# Patient Record
Sex: Female | Born: 1980 | Race: White | Hispanic: No | Marital: Married | Smoking: Never smoker
Health system: Southern US, Community
[De-identification: ages and names within clinical notes are randomized; demographics above are authoritative.]

## PROBLEM LIST (undated history)

## (undated) ENCOUNTER — Inpatient Hospital Stay (HOSPITAL_COMMUNITY): Payer: Self-pay

## (undated) DIAGNOSIS — Z8742 Personal history of other diseases of the female genital tract: Secondary | ICD-10-CM

## (undated) DIAGNOSIS — D649 Anemia, unspecified: Secondary | ICD-10-CM

## (undated) DIAGNOSIS — Z973 Presence of spectacles and contact lenses: Secondary | ICD-10-CM

## (undated) DIAGNOSIS — N920 Excessive and frequent menstruation with regular cycle: Secondary | ICD-10-CM

## (undated) DIAGNOSIS — R112 Nausea with vomiting, unspecified: Secondary | ICD-10-CM

## (undated) DIAGNOSIS — Z9889 Other specified postprocedural states: Secondary | ICD-10-CM

## (undated) HISTORY — PX: CRYOTHERAPY: SHX1416

---
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
## 2000-01-13 ENCOUNTER — Encounter (INDEPENDENT_AMBULATORY_CARE_PROVIDER_SITE_OTHER): Payer: Self-pay

## 2000-01-13 ENCOUNTER — Other Ambulatory Visit: Admission: RE | Admit: 2000-01-13 | Discharge: 2000-01-13 | Payer: Self-pay | Admitting: Obstetrics and Gynecology

## 2002-07-19 ENCOUNTER — Emergency Department (HOSPITAL_COMMUNITY): Admission: EM | Admit: 2002-07-19 | Discharge: 2002-07-19 | Payer: Self-pay | Admitting: Emergency Medicine

## 2003-07-20 ENCOUNTER — Inpatient Hospital Stay (HOSPITAL_COMMUNITY): Admission: AD | Admit: 2003-07-20 | Discharge: 2003-07-20 | Payer: Self-pay | Admitting: *Deleted

## 2003-07-22 ENCOUNTER — Inpatient Hospital Stay (HOSPITAL_COMMUNITY): Admission: AD | Admit: 2003-07-22 | Discharge: 2003-07-26 | Payer: Self-pay | Admitting: Obstetrics and Gynecology

## 2003-07-24 ENCOUNTER — Encounter (INDEPENDENT_AMBULATORY_CARE_PROVIDER_SITE_OTHER): Payer: Self-pay | Admitting: Specialist

## 2008-04-16 ENCOUNTER — Emergency Department (HOSPITAL_COMMUNITY): Admission: EM | Admit: 2008-04-16 | Discharge: 2008-04-16 | Payer: Self-pay | Admitting: Emergency Medicine

## 2008-10-01 ENCOUNTER — Ambulatory Visit (HOSPITAL_COMMUNITY): Admission: RE | Admit: 2008-10-01 | Discharge: 2008-10-01 | Payer: Self-pay | Admitting: Obstetrics & Gynecology

## 2009-01-01 ENCOUNTER — Inpatient Hospital Stay (HOSPITAL_COMMUNITY): Admission: AD | Admit: 2009-01-01 | Discharge: 2009-01-04 | Payer: Self-pay | Admitting: Obstetrics and Gynecology

## 2009-01-08 ENCOUNTER — Ambulatory Visit (HOSPITAL_COMMUNITY): Admission: AD | Admit: 2009-01-08 | Discharge: 2009-01-08 | Payer: Self-pay | Admitting: Obstetrics and Gynecology

## 2009-01-09 ENCOUNTER — Ambulatory Visit (HOSPITAL_COMMUNITY): Admission: AD | Admit: 2009-01-09 | Discharge: 2009-01-09 | Payer: Self-pay | Admitting: Obstetrics and Gynecology

## 2009-01-10 ENCOUNTER — Ambulatory Visit (HOSPITAL_COMMUNITY): Admission: AD | Admit: 2009-01-10 | Discharge: 2009-01-10 | Payer: Self-pay | Admitting: Obstetrics and Gynecology

## 2009-01-11 ENCOUNTER — Ambulatory Visit (HOSPITAL_COMMUNITY): Admission: AD | Admit: 2009-01-11 | Discharge: 2009-01-11 | Payer: Self-pay | Admitting: Obstetrics and Gynecology

## 2009-01-12 ENCOUNTER — Ambulatory Visit (HOSPITAL_COMMUNITY): Admission: AD | Admit: 2009-01-12 | Discharge: 2009-01-12 | Payer: Self-pay | Admitting: Obstetrics and Gynecology

## 2009-01-14 ENCOUNTER — Ambulatory Visit (HOSPITAL_COMMUNITY): Admission: AD | Admit: 2009-01-14 | Discharge: 2009-01-14 | Payer: Self-pay | Admitting: Obstetrics and Gynecology

## 2009-01-15 ENCOUNTER — Ambulatory Visit (HOSPITAL_COMMUNITY): Admission: AD | Admit: 2009-01-15 | Discharge: 2009-01-15 | Payer: Self-pay | Admitting: Obstetrics and Gynecology

## 2009-01-18 ENCOUNTER — Ambulatory Visit (HOSPITAL_COMMUNITY): Admission: AD | Admit: 2009-01-18 | Discharge: 2009-01-18 | Payer: Self-pay | Admitting: Obstetrics & Gynecology

## 2009-01-19 ENCOUNTER — Ambulatory Visit (HOSPITAL_COMMUNITY): Admission: AD | Admit: 2009-01-19 | Discharge: 2009-01-19 | Payer: Self-pay | Admitting: Obstetrics and Gynecology

## 2009-03-16 ENCOUNTER — Ambulatory Visit (HOSPITAL_COMMUNITY): Admission: RE | Admit: 2009-03-16 | Discharge: 2009-03-16 | Payer: Self-pay | Admitting: General Surgery

## 2009-03-16 ENCOUNTER — Encounter (INDEPENDENT_AMBULATORY_CARE_PROVIDER_SITE_OTHER): Payer: Self-pay | Admitting: General Surgery

## 2009-03-16 HISTORY — PX: LAPAROSCOPIC CHOLECYSTECTOMY: SUR755

## 2010-12-27 LAB — COMPREHENSIVE METABOLIC PANEL
ALT: 40 U/L — ABNORMAL HIGH (ref 0–35)
AST: 38 U/L — ABNORMAL HIGH (ref 0–37)
Alkaline Phosphatase: 98 U/L (ref 39–117)
CO2: 27 mEq/L (ref 19–32)
Calcium: 9.7 mg/dL (ref 8.4–10.5)
GFR calc Af Amer: >60 mL/min (ref >60)
GFR calc non Af Amer: >60 mL/min (ref >60)
Glucose, Bld: 93 mg/dL (ref 70–99)
Potassium: 4.1 mEq/L (ref 3.5–5.1)
Sodium: 140 mEq/L (ref 135–145)
Total Protein: 7.2 g/dL (ref 6.0–8.3)

## 2010-12-27 LAB — DIFFERENTIAL
Basophils Relative: 1 % (ref 0–1)
Eosinophils Absolute: 1.5 10*3/uL — ABNORMAL HIGH (ref 0.0–0.7)
Eosinophils Relative: 15 % — ABNORMAL HIGH (ref 0–5)
Lymphs Abs: 2.5 10*3/uL (ref 0.7–4.0)
Monocytes Relative: 5 % (ref 3–12)

## 2010-12-27 LAB — CBC
Hemoglobin: 12.8 g/dL (ref 12.0–15.0)
MCHC: 32.5 g/dL (ref 30.0–36.0)
RBC: 5.02 MIL/uL (ref 3.87–5.11)

## 2010-12-29 LAB — CBC
MCHC: 34.5 g/dL (ref 30.0–36.0)
MCV: 79.6 fL (ref 78.0–100.0)
Platelets: 230 10*3/uL (ref 150–400)
Platelets: 305 10*3/uL (ref 150–400)
RBC: 4.51 MIL/uL (ref 3.87–5.11)
RDW: 14.7 % (ref 11.5–15.5)
WBC: 14.3 10*3/uL — ABNORMAL HIGH (ref 4.0–10.5)

## 2010-12-29 LAB — RPR: RPR Ser Ql: NONREACTIVE

## 2011-02-01 NOTE — Op Note (Signed)
Deborah [name], Deborah [name]               ACCOUNT NO.:  [id number]   MEDICAL RECORD NO.:  [id number]          PATIENT TYPE:  INP   LOCATION:  9147                          FACILITY:  [facility]   PHYSICIAN:  Zelphia Cairo, MD    DATE OF BIRTH:  1981/07/06   DATE OF PROCEDURE:  DATE OF DISCHARGE:  01/04/2009                               OPERATIVE REPORT   PREOPERATIVE DIAGNOSES:  1. Intrauterine pregnancy at term.  2. Labor.  3. Prior cesarean section, desires repeat.   POSTOPERATIVE DIAGNOSIS:  1. Intrauterine pregnancy at term.  2. Labor.  3. Prior cesarean section, desires repeat.   PROCEDURE:  Repeat low-transverse cesarean delivery.   SURGEON:  Zelphia Cairo, MD   ANESTHESIA:  Spinal.   COMPLICATIONS:  None.   FINDINGS:  Viable infant female with Apgars of 7 and 8, normal pelvic  anatomy.   COMPLICATIONS:  None.   CONDITION:  Stable to recovery room.   PROCEDURE IN DETAIL:  The patient was taken to the operating room after  informed consent was obtained.  She was placed in the supine position  with a left tilt after spinal anesthesia was found to be adequate.  She  was prepped and draped in sterile fashion.  Foley catheter was inserted  sterilely.  Pfannenstiel skin incision was made with a scalpel and  carried down to the underlying fascia.  Fascia was then incised in the  midline and extended laterally using Mayo scissors.  Kocher clamps were  used to grasp the superior portion of the fascia, was tented upwards and  the underlying rectus muscles were dissected off using the Bovie.  Peritoneum was then identified and entered sharply.  Peritoneal incision  was then extended superiorly and inferiorly with good visualization of  the bladder.  Bladder blade was then inserted.  Vesicouterine peritoneum  was dissected off the lower uterine segment and the bladder blade was  replaced to protect the bladder flap.   Uterine incision was made with a scalpel and extended bluntly.   Fetal  vertex was brought to the uterine incision.  The mouth and nose were  suctioned.  The shoulders and body followed with fundal pressure.  The  infant was dried and the cord was clamped and cut and the infant was  taken to the awaiting pediatric staff.  The uterus was exteriorized from  the pelvis.  The placenta was manually removed and then the uterus was  cleared of all clots and debris using a dry lap sponge.  The uterine  incision was reapproximated using 0 chromic double layer fashion and a  running locked stitch.  Once hemostasis was assured, the uterus placed  back into the pelvis and the pelvis was irrigated with warm normal  saline.  Peritoneum was reapproximated with Monocryl.  The fascia was  closed with a looped 0 PDS and the skin was closed with staples.  Sponge, lap, needle, and instrument counts were correct x2.      Zelphia Cairo, MD  Electronically Signed     GA/MEDQ  D:  03/06/2009  T:  03/07/2009  Job:  [id number] 

## 2011-02-01 NOTE — Discharge Summary (Signed)
NAMEAUTUMNE, Deborah [name]               ACCOUNT NO.:  [id number]   MEDICAL RECORD NO.:  [id number]          PATIENT TYPE:  INP   LOCATION:  9147                          FACILITY:  [facility]   PHYSICIAN:  Duke Salvia. Marcelle Overlie, M.D.DATE OF BIRTH:  14-Apr-1981   DATE OF ADMISSION:  01/01/2009  DATE OF DISCHARGE:  01/04/2009                               DISCHARGE SUMMARY   ADMISSION DIAGNOSES:  1. Intrauterine pregnancy at term.  2. Previous cesarean section, desires repeat.  3. Spontaneous onset of labor.   DISCHARGE DIAGNOSES:  1. Status post low-transverse cesarean section.  2. Viable female infant.   PROCEDURE:  Repeat low-transverse cesarean section.   REASON FOR ADMISSION:  Please see written H and P.   HOSPITAL COURSE:  The patient is a 30 year old gravida 2, para 1 that  was admitted to [facility] at 26 weeks' estimated  gestational age with spontaneous onset of labor.  The patient had had a  previous cesarean section, desired repeat.  The patient was then  transferred to the operating room where spinal anesthesia was  administered without difficulty.  Low-transverse incision was made with  delivery of a viable female infant with Apgars of 7 at one minute and 8  at five minutes.  The patient tolerated the procedure well and taken to  the recovery room in stable condition.  On postoperative day #1, the  patient was without complaint.  Vital signs were stable.  She was  afebrile.  Fundus firm and nontender.  Abdominal dressings were noted to  have a small amount of drainage noted on the bandage.  Laboratory  findings showed hemoglobin of 10.3, platelet count of 230,000, WBC count  of 9.6, and blood type was noted to be A positive.  On postoperative day  #2, the patient was without complaint.  Vital signs were stable.  She  was afebrile.  Fundus firm and nontender.  Incision was clean, dry, and  intact.  On postoperative day #3, the patient was without complaint.  Vital  signs remained stable.  She was afebrile.  Fundus firm and  nontender.  Incision was clean, dry, and intact.  Staples removed and  the patient was later discharged home.   CONDITION ON DISCHARGE:  Stable.   DIET:  Regular as tolerated.   ACTIVITY:  No heavy lifting, no driving x2 weeks, and no vaginal entry.   FOLLOWUP:  The patient is to follow up in the office in 1-2 weeks for  incision check.  She is to call for temperature greater than 100  degrees, persistent nausea, vomiting, heavy vaginal bleeding and/or  redness, or drainage from the incisional site.   DISCHARGE MEDICATIONS:  1. Tylox #30 one p.o. every 4-6 h. p.r.n.  2. Motrin 600 mg every 6 hours.  3. Prenatal vitamins 1 p.o. daily.  4. Colace 1 p.o. daily p.r.n.      Julio Sicks, N.P.      Richard M. Marcelle Overlie, M.D.  Electronically Signed    CC/MEDQ  D:  03/10/2009  T:  03/11/2009  Job:  [id number]

## 2011-02-01 NOTE — Op Note (Signed)
Deborah [name], Deborah [name]               ACCOUNT NO.:  [id number]   MEDICAL RECORD NO.:  [id number]          PATIENT TYPE:  AMB   LOCATION:  SDS                          FACILITY:  [facility]   PHYSICIAN:  Cherylynn Ridges, M.D.    DATE OF BIRTH:  1981/01/22   DATE OF PROCEDURE:  03/16/2009  DATE OF DISCHARGE:  03/16/2009                               OPERATIVE REPORT   PREOPERATIVE DIAGNOSIS:  Symptomatic cholelithiasis.   POSTOPERATIVE DIAGNOSIS:  Large gallstones and chronic cholecystitis.   PROCEDURE:  Laparoscopic cholecystectomy with cholangiogram.   SURGEON:  Marta Lamas. Lindie Spruce, M.D.   ASSISTANT:  Anselm Pancoast. Zachery Dakins, M.D.   ANESTHESIA:  General endotracheal.   ESTIMATED BLOOD LOSS:  100 mL.   COMPLICATION:  Bleeding from right inferior epigastric artery.   CONDITION:  Stable.   INDICATIONS FOR OPERATION:  The patient is a 30 year old female with  abdominal pain for several months, was pregnant, initially diagnosed  with gallstones and now comes in for an elective laparoscopic  cholecystectomy.   OPERATION:  The patient was taken to the operating room, placed on table  in the supine position.  After an adequate general endotracheal  anesthetic was administered, she was prepped and draped in the usual  sterile manner exposing the midline.   A supraumbilical midline incision was made down to the fascia.  We  incised the fascia using #15 blade and grabbed the edges of the fascia  using a Kocher clamp.  We were just to the right of the midline in the  muscle but we were able to dissect down through the posterior sheath  into the peritoneal cavity.  We then passed a pursestring suture of 0  Vicryl around the fascial opening, then passed a Hasson cannula into the  peritoneal cavity with minimal difficulty.   Once this was done, we placed the patient in reverse Trendelenburg with  the left side tilted down.  A right upper quadrant 5-mm cannula site was  placed along with the second one  in the right upper quadrant and then a  subxiphoid 5-mm cannula site was placed.  We then started the operation.   There were dense omental adhesions to the body in the infundibulum of  gallbladder which was dissected away free by electrocautery and blunt  dissection.  The duodenum was also drawn up towards the infundibulum  which was free without injuring it.   We were able to dissect out the cystic duct and cystic artery at the  triangle of Calot and then placed a clip along the gallbladder site.  A  cholecystodochotomy made using laparoscopic scissors.  The cholangiogram  was performed using a Cook catheter which had been passed through the  anterior abdominal wall.  Once this was done, it showed good flow into  the duodenum.  No obstruction.  No filling defects and good proximal  filling.  Once this was done, we removed the clips securing the catheter  in place, distally clipped the cystic duct x2 and then transected.  The  cystic artery was clipped proximally and distally x2 and transected.  We  then dissected gallbladder out of the hepatic bed with minimal  difficulty; however, because of the large size of stones containing  within it, we had some difficulty bringing in out supraumbilical  fashion.  In order to do so, we actually had to open the fascia  proximally between the stay sutures and in doing so we likely nicked the  right inferior epigastric artery.  We did retrieve the gallbladder  intact along with the stones, but then had to place some intraperitoneal  stitches in order to control the bleeding around the fascial opening.  This was done with the suture retriever and passer which came out on  each side of fascial opening which controlled the bleeding.  An  additional 0 Vicryl suture was also placed in order to control the  bleeding along with stay suture which was in place.   At the end of this, there was no more bleeding.  We bled approximately  100 mL into the  peritoneal cavity and externally.  There was also some  spillage of bowel externally but not intraperitoneally.  Once we had  completely removed the gallbladder and controlled the bleeding and  closed the fascia at supraumbilical site.  We removed all cannulas.  We  did irrigate with about 1.5 to 2 liters of saline.  There was no  bleeding subsequently.  All gas and fluid was aspirated, and then  cannulas removed.   The supraumbilical cannula site was closed using running subcuticular  suture of 4-0 Monocryl.  All sites were injected with 0.25% Marcaine  with epinephrine.  Once the supraumbilical site was closed, Dermabond  was placed on that and Dermabond was used as primary closure of all  other sites.  Steri-Strips and Tegaderm were used to complete all  dressings.  All needle counts, sponge counts, and instrument counts were  correct.      Cherylynn Ridges, M.D.  Electronically Signed     JOW/MEDQ  D:  03/16/2009  T:  03/17/2009  Job:  [id number]   cc:   Marcelino Duster L. Vincente Poli, M.D.

## 2011-02-04 NOTE — Discharge Summary (Signed)
NAME:  Deborah [name], Deborah [name]                       ACCOUNT NO.:  [id number]   MEDICAL RECORD NO.:  [id number]                   PATIENT TYPE:  INP   LOCATION:  9142                                 FACILITY:  [facility]   PHYSICIAN:  Freddy Finner, M.D.                DATE OF BIRTH:  1980-11-14   DATE OF ADMISSION:  07/22/2003  DATE OF DISCHARGE:  07/26/2003                                 DISCHARGE SUMMARY   ADMISSION DIAGNOSES:  1. Intrauterine pregnancy, 40-4/7 weeks estimated gestational age.  2. Induction of labor for post date.   DISCHARGE DIAGNOSES:  1. Status post low-transverse cesarean section secondary to failure to     progress and fetal intolerance to labor.  2. Viable female infant.   PROCEDURE:  Primary low-transverse cesarean section.   REASON FOR ADMISSION:  Please see written H&P.   HOSPITAL COURSE:  The patient was a 30 year old married female, primigravida  that presented to [facility] for a post date induction.  The  pregnancy had been otherwise uncomplicated.  On the evening of admission,  Cervidil was administered intravaginally for ripening of the cervix.  On the  following morning, Pitocin was started per protocol.  Later that morning,  the cervix was noted to be 1-cm dilated, long with vertex presentation.  Artifical rupture of membranes was performed revealing clear fluid.  Fetal  heart tones were reactive.  Epidural was placed for the patient's comfort  and Pitocin augmentation continued.  Approximately one hour prior to the  decision for the cesarean, several repetitive late decelerations were noted  and Pitocin was discontinued.  The patient was noted to be 1- to -2-cm  dilated at that point.  She was repositioned and oxygen was administered at  that time with resolution.  However, she continued to have an episode of  bradycardia in the 60s that lasted 7-8 minutes with ultimate resolution but  repetitive late decelerations were noted at that  point which did slowly  resolve.  The decision was made to proceed with a cesarean delivery.  The  patient was then transferred to the operating room where epidural was dosed  to an adequate surgical level.  A low transverse incision was made with the  delivery of a viable female infant weighing 7 pounds 8 ounces with Apgar's of  8 at one minute and 9 at five minutes.  Umbilical cord pH was 7.28.  There  was a loose nuchal cord x 2 that was noted at the time of delivery.  The  patient tolerated the procedure well.  She was taken to the recovery room in  stable condition.   On postoperative day one, vital signs were stable, patient was afebrile.  Abdomen was soft with good return of bowel function.  Fundus was firm and  nontender.  Abdominal dressing was clean, dry and intact.  Labs revealed a  hemoglobin of 9.6, platelet count of 274,000,  WBC count of 8.9.  On  postoperative day two, the patient did complain of some left sided soreness  otherwise vital signs were stable.  She was afebrile.  Fundus was firm and  nontender.  There was a small amount of oozing that was noted from the left  side of the incision which was old.  Staples were intact.  The patient was  ambulating well and tolerating a regular diet without complaints of nausea  or vomiting.  On postoperative day three, the patient was doing well.  Vital  signs were stable.  Fundus was firm and nontender.  Incision was clean, dry  and intact.  The staples were removed and the patient was discharged home.   CONDITION ON DISCHARGE:  Good.   DIET:  Regular as tolerated.   ACTIVITY:  No heavy lifting, no driving x 2 weeks, no vaginal entry.   FOLLOWUP:  The patient is to followup in the office in 1-2 weeks for an  incision check.  She is to call for a temperature of greater than 100  degrees, persistent nausea vomiting, heavy vaginal bleeding and evidence of  drainage from the incisional site.   DISCHARGE MEDICATIONS:  1. Percocet  5/325, #30, one p.o. every four to six hours p.r.n. pain.  2. Motrin 600 mg every six hours.  3. Prenatal vitamins, one p.o. daily.  4. Colace one p.o. daily p.r.n.     Julio Sicks, N.P.                        Freddy Finner, M.D.    CC/MEDQ  D:  08/29/2003  T:  08/30/2003  Job:  [id number]

## 2011-02-04 NOTE — Op Note (Signed)
NAME:  Deborah [name], Deborah [name]                       ACCOUNT NO.:  [id number]   MEDICAL RECORD NO.:  [id number]                   PATIENT TYPE:  INP   LOCATION:  9142                                 FACILITY:  [facility]   PHYSICIAN:  Duke Salvia. Marcelle Overlie, M.D.            DATE OF BIRTH:  1980/12/01   DATE OF PROCEDURE:  07/23/2003  DATE OF DISCHARGE:                                 OPERATIVE REPORT   PREOPERATIVE DIAGNOSIS:  Fetal intolerance to labor.   POSTOPERATIVE DIAGNOSES:  1. Fetal intolerance to labor.  2. Nuchal cord x2.  3. Occiput posterior presentation.   PROCEDURE:  Primary low transverse cesarean section.   SURGEON:  Duke Salvia. Marcelle Overlie, M.D.   ANESTHESIA:  Epidural.   COMPLICATIONS:  None.   DRAINS:  Foley catheter.   ESTIMATED BLOOD LOSS:  600.   INDICATIONS:  This patient presented for a two-stage labor induction for  being post dates.  Approximately one hour prior to the decision for  cesarean, several repetitive late decelerations were noted and the Pitocin  was discontinued and she was 1-2 cm dilated at that point.  She was  repositioned and oxygen was started at that time with resolution.  However,  she had an episode of bradycardia into the 60s lasting seven to eight  minutes with ultimate resolution, but repetitive late decelerations were  noted at that point which slowly did resolve as the decision was being made  to proceed for CS.   The patient was taken to the operating room.  After an adequate level of  anesthesia was obtained with the patient in a left tilt position, the  abdomen was prepped and draped in the usual manner for sterile abdominal  procedures.  A Foley catheter was already positioned draining clear urine.  A Pfannenstiel incision made two fingerbreadths above the symphysis, carried  down to the fascia, which was incised and extended transversely.  The rectus  muscle divided in the midline, peritoneum entered superiorly without  incident and  extended in a vertical manner.  The vesicouterine serosa was  then incised and the bladder was bluntly and sharply dissected off of the  lower uterine segment and the bladder blade was repositioned.  A transverse  incision made in the lower segment and clear fluid noted, extended with  bandage scissors.  The infant was presenting ROP, there was a loose nuchal  cord x2.  Easy delivery, the nuchal cord was reduced, and the rest of the  infant was delivered without difficulty.  Weight was 7 pounds 8 ounces, pH  7.28, Apgars were 8 and 9.  The infant was suctioned, the cord clamped, and  passed to the pediatric team for further care.  The placenta delivered  manually intact, was sent to pathology.  The uterus was exteriorized, the  cavity wiped clean with a laparotomy pack, closure obtained with a first  layer of 0 chromic in a locked fashion, followed  by an imbricating layer of  0 chromic.  This was hemostatic.  Tubes and ovaries were normal.  Prior to  closure sponge, needle, and instrument counts were reported as correct x2.  The peritoneum was closed separately with a 2-0 Dexon running suture.  Interrupted 2-0 Dexon suture was used on the rectus muscles to reapproximate  them in the midline, 0 PDS suture from lateral to midline on either side.  The subcutaneous fat was hemostatic, clips and Steri-Strips used on the  skin.  Clear urine noted at the end of the case.  She went to the recovery  room in good condition.  Pitocin IV and antibiotic dosing was given IV after  the cord was clamped.                                               Richard M. Marcelle Overlie, M.D.    [facility]H/MEDQ  D:  07/23/2003  T:  07/24/2003  Job:  [id number]

## 2011-11-03 LAB — OB RESULTS CONSOLE RPR: RPR: NONREACTIVE

## 2011-11-03 LAB — OB RESULTS CONSOLE ABO/RH: RH Type: POSITIVE

## 2011-11-23 ENCOUNTER — Other Ambulatory Visit: Payer: Self-pay | Admitting: Obstetrics and Gynecology

## 2012-04-23 ENCOUNTER — Inpatient Hospital Stay (HOSPITAL_COMMUNITY)
Admission: AD | Admit: 2012-04-23 | Discharge: 2012-04-23 | Disposition: A | Discharge status: Hospice | Payer: 59 | Source: Ambulatory Visit | Attending: Obstetrics and Gynecology | Admitting: Obstetrics and Gynecology

## 2012-04-23 ENCOUNTER — Encounter (HOSPITAL_COMMUNITY): Payer: Self-pay

## 2012-04-23 DIAGNOSIS — O4700 False labor before 37 completed weeks of gestation, unspecified trimester: Secondary | ICD-10-CM | POA: Insufficient documentation

## 2012-04-23 NOTE — MAU Note (Signed)
Patient states she is having contractions every 7 minutes. Reports fetal movement but less than usual. Denies any bleeding or leaking. Patient is scheduled for a repeat cesarean on 8-28.

## 2012-04-29 ENCOUNTER — Encounter (HOSPITAL_COMMUNITY): Payer: Self-pay | Admitting: *Deleted

## 2012-04-29 ENCOUNTER — Inpatient Hospital Stay (HOSPITAL_COMMUNITY)
Admission: AD | Admit: 2012-04-29 | Discharge: 2012-04-29 | Disposition: A | Discharge status: Home / Self Care | Payer: 59 | Source: Ambulatory Visit | Attending: Obstetrics and Gynecology | Admitting: Obstetrics and Gynecology

## 2012-04-29 DIAGNOSIS — O479 False labor, unspecified: Secondary | ICD-10-CM

## 2012-04-29 HISTORY — DX: Other specified postprocedural states: R11.2

## 2012-04-29 HISTORY — DX: Nausea with vomiting, unspecified: Z98.890

## 2012-04-29 LAB — URINALYSIS, ROUTINE W REFLEX MICROSCOPIC
Bilirubin Urine: NEGATIVE
Leukocytes, UA: NEGATIVE
Nitrite: NEGATIVE
Specific Gravity, Urine: 1.02 (ref 1.005–1.030)
Urobilinogen, UA: 0.2 mg/dL (ref 0.0–1.0)

## 2012-04-29 NOTE — Evaluation (Signed)
Presents at 36.5 with increasing ctx's.  For repeat cesarean section.  Cervix long and closed and posterior.  No change with observation.  FHR reactive no decels.  Regular mild ctx's False labor D/C home. Come back with increasing ctx's, leakage, bleeding or decreased fetaql movemenet

## 2012-04-29 NOTE — MAU Note (Signed)
Pt states, " I started contracting at 10 pm ,and they were regular by 11 pm and about 3 minutes apart."

## 2012-05-01 ENCOUNTER — Encounter (HOSPITAL_COMMUNITY): Payer: Self-pay | Admitting: Pharmacist

## 2012-05-09 ENCOUNTER — Encounter (HOSPITAL_COMMUNITY)
Admission: RE | Admit: 2012-05-09 | Discharge: 2012-05-09 | Disposition: A | Discharge status: Home / Self Care | Payer: 59 | Source: Ambulatory Visit | Attending: Obstetrics and Gynecology | Admitting: Obstetrics and Gynecology

## 2012-05-09 ENCOUNTER — Other Ambulatory Visit (HOSPITAL_COMMUNITY): Payer: 59

## 2012-05-09 ENCOUNTER — Encounter (HOSPITAL_COMMUNITY): Payer: Self-pay

## 2012-05-09 LAB — CBC
MCH: 29.2 pg (ref 26.0–34.0)
Platelets: 261 10*3/uL (ref 150–400)
RBC: 4.73 MIL/uL (ref 3.87–5.11)
WBC: 10.7 10*3/uL — ABNORMAL HIGH (ref 4.0–10.5)

## 2012-05-09 LAB — SURGICAL PCR SCREEN: MRSA, PCR: NEGATIVE

## 2012-05-09 LAB — RPR: RPR Ser Ql: NONREACTIVE

## 2012-05-09 NOTE — Patient Instructions (Addendum)
   Your procedure is scheduled on: Thursday August 22nd  Enter through the Main Entrance of [facility] at: 11:30am Pick up the phone at the desk and dial [phone] and inform us of your arrival.  Please call this number if you have any problems the morning of surgery: [phone]  Remember: Do not eat food after midnight: Wednesday Do not drink clear liquids after: 9am Thursday   Do not wear jewelry, make-up, or FINGER nail polish No metal in your hair or on your body. Do not wear lotions, powders, perfumes or deodorant. Do not shave 48 hours prior to surgery. Do not bring valuables to the hospital.   Leave suitcase in the car. After Surgery it may be brought to your room. For patients being admitted to the hospital, checkout time is 11:00am the day of discharge.     Remember to use your hibiclens as instructed.Please shower with 1/2 bottle the evening before your surgery and the other 1/2 bottle the morning of surgery. Neck down avoiding private area.

## 2012-05-10 ENCOUNTER — Encounter (HOSPITAL_COMMUNITY)
Admission: RE | Disposition: A | Discharge status: Home / Self Care | Payer: Self-pay | Source: Ambulatory Visit | Attending: Obstetrics and Gynecology

## 2012-05-10 ENCOUNTER — Encounter (HOSPITAL_COMMUNITY): Payer: Self-pay | Admitting: General Surgery

## 2012-05-10 ENCOUNTER — Inpatient Hospital Stay (HOSPITAL_COMMUNITY): Payer: 59 | Admitting: Anesthesiology

## 2012-05-10 ENCOUNTER — Inpatient Hospital Stay (HOSPITAL_COMMUNITY)
Admission: RE | Admit: 2012-05-10 | Discharge: 2012-05-13 | DRG: 766 | Disposition: A | Discharge status: Home / Self Care | Payer: 59 | Source: Ambulatory Visit | Attending: Obstetrics and Gynecology | Admitting: Obstetrics and Gynecology

## 2012-05-10 ENCOUNTER — Encounter (HOSPITAL_COMMUNITY): Payer: Self-pay | Admitting: Anesthesiology

## 2012-05-10 DIAGNOSIS — Z302 Encounter for sterilization: Secondary | ICD-10-CM

## 2012-05-10 DIAGNOSIS — O34219 Maternal care for unspecified type scar from previous cesarean delivery: Principal | ICD-10-CM | POA: Diagnosis present

## 2012-05-10 DIAGNOSIS — Z01812 Encounter for preprocedural laboratory examination: Secondary | ICD-10-CM

## 2012-05-10 DIAGNOSIS — Z01818 Encounter for other preprocedural examination: Secondary | ICD-10-CM

## 2012-05-10 SURGERY — Surgical Case
Anesthesia: Spinal | Site: Abdomen | Laterality: Bilateral | Wound class: Clean Contaminated

## 2012-05-10 MED ORDER — IBUPROFEN 600 MG PO TABS
600.0000 mg | ORAL_TABLET | Freq: Four times a day (QID) | ORAL | Status: DC
  Administered 2012-05-10 – 2012-05-13 (×11): 600 mg via ORAL
  Filled 2012-05-10 (×11): qty 1

## 2012-05-10 MED ORDER — SENNOSIDES-DOCUSATE SODIUM 8.6-50 MG PO TABS
2.0000 | ORAL_TABLET | Freq: Every day | ORAL | Status: DC
  Administered 2012-05-10 – 2012-05-12 (×3): 2 via ORAL

## 2012-05-10 MED ORDER — LACTATED RINGERS IV SOLN: INTRAVENOUS | Status: DC

## 2012-05-10 MED ORDER — FENTANYL CITRATE 0.05 MG/ML IJ SOLN
INTRAMUSCULAR | Status: AC
Start: 2012-05-10 — End: 2012-05-10
  Filled 2012-05-10: qty 2

## 2012-05-10 MED ORDER — KETOROLAC TROMETHAMINE 60 MG/2ML IM SOLN
INTRAMUSCULAR | Status: AC
  Administered 2012-05-10: 60 mg via INTRAMUSCULAR
  Filled 2012-05-10: qty 2

## 2012-05-10 MED ORDER — PHENYLEPHRINE 40 MCG/ML (10ML) SYRINGE FOR IV PUSH (FOR BLOOD PRESSURE SUPPORT)
PREFILLED_SYRINGE | INTRAVENOUS | Status: AC
  Filled 2012-05-10: qty 15

## 2012-05-10 MED ORDER — MEPERIDINE HCL 25 MG/ML IJ SOLN: 6.2500 mg | INTRAMUSCULAR | Status: DC | PRN

## 2012-05-10 MED ORDER — KETOROLAC TROMETHAMINE 30 MG/ML IJ SOLN: 30.0000 mg | Freq: Four times a day (QID) | INTRAMUSCULAR | Status: AC | PRN

## 2012-05-10 MED ORDER — PHENYLEPHRINE 40 MCG/ML (10ML) SYRINGE FOR IV PUSH (FOR BLOOD PRESSURE SUPPORT)
PREFILLED_SYRINGE | INTRAVENOUS | Status: AC
  Filled 2012-05-10: qty 10

## 2012-05-10 MED ORDER — PRENATAL MULTIVITAMIN CH
1.0000 | ORAL_TABLET | Freq: Every day | ORAL | Status: DC
  Administered 2012-05-11 – 2012-05-13 (×3): 1 via ORAL
  Filled 2012-05-10 (×4): qty 1

## 2012-05-10 MED ORDER — DIPHENHYDRAMINE HCL 25 MG PO CAPS: 25.0000 mg | ORAL_CAPSULE | ORAL | Status: DC | PRN

## 2012-05-10 MED ORDER — ONDANSETRON HCL 4 MG/2ML IJ SOLN
4.0000 mg | INTRAMUSCULAR | Status: DC | PRN
  Administered 2012-05-10: 4 mg via INTRAVENOUS

## 2012-05-10 MED ORDER — BUPIVACAINE HCL (PF) 0.25 % IJ SOLN
INTRAMUSCULAR | Status: AC
  Filled 2012-05-10: qty 30

## 2012-05-10 MED ORDER — DIPHENHYDRAMINE HCL 50 MG/ML IJ SOLN: 25.0000 mg | INTRAMUSCULAR | Status: DC | PRN

## 2012-05-10 MED ORDER — NALBUPHINE HCL 10 MG/ML IJ SOLN
5.0000 mg | INTRAMUSCULAR | Status: DC | PRN
  Filled 2012-05-10: qty 1

## 2012-05-10 MED ORDER — DIPHENHYDRAMINE HCL 25 MG PO CAPS: 25.0000 mg | ORAL_CAPSULE | Freq: Four times a day (QID) | ORAL | Status: DC | PRN

## 2012-05-10 MED ORDER — LACTATED RINGERS IV SOLN
INTRAVENOUS | Status: DC
  Administered 2012-05-10 (×4): via INTRAVENOUS

## 2012-05-10 MED ORDER — LACTATED RINGERS IV SOLN
INTRAVENOUS | Status: DC
  Administered 2012-05-11: 02:00:00 via INTRAVENOUS

## 2012-05-10 MED ORDER — ONDANSETRON HCL 4 MG PO TABS: 4.0000 mg | ORAL_TABLET | ORAL | Status: DC | PRN

## 2012-05-10 MED ORDER — DIBUCAINE 1 % RE OINT: 1.0000 "application " | TOPICAL_OINTMENT | RECTAL | Status: DC | PRN

## 2012-05-10 MED ORDER — ZOLPIDEM TARTRATE 5 MG PO TABS: 5.0000 mg | ORAL_TABLET | Freq: Every evening | ORAL | Status: DC | PRN

## 2012-05-10 MED ORDER — DIPHENHYDRAMINE HCL 50 MG/ML IJ SOLN: 12.5000 mg | INTRAMUSCULAR | Status: DC | PRN

## 2012-05-10 MED ORDER — CEFAZOLIN SODIUM-DEXTROSE 2-3 GM-% IV SOLR: 2.0000 g | INTRAVENOUS | Status: DC

## 2012-05-10 MED ORDER — PHENYLEPHRINE HCL 10 MG/ML IJ SOLN
INTRAMUSCULAR | Status: DC | PRN
  Administered 2012-05-10 (×3): 40 ug via INTRAVENOUS
  Administered 2012-05-10 (×2): 80 ug via INTRAVENOUS
  Administered 2012-05-10 (×2): 40 ug via INTRAVENOUS
  Administered 2012-05-10: 80 ug via INTRAVENOUS
  Administered 2012-05-10: 40 ug via INTRAVENOUS
  Administered 2012-05-10: 80 ug via INTRAVENOUS
  Administered 2012-05-10 (×2): 40 ug via INTRAVENOUS

## 2012-05-10 MED ORDER — PHENYLEPHRINE 40 MCG/ML (10ML) SYRINGE FOR IV PUSH (FOR BLOOD PRESSURE SUPPORT)
PREFILLED_SYRINGE | INTRAVENOUS | Status: AC
  Filled 2012-05-10: qty 5

## 2012-05-10 MED ORDER — SCOPOLAMINE 1 MG/3DAYS TD PT72
MEDICATED_PATCH | TRANSDERMAL | Status: AC
  Administered 2012-05-10: 1.5 mg via TRANSDERMAL
  Filled 2012-05-10: qty 1

## 2012-05-10 MED ORDER — MENTHOL 3 MG MT LOZG: 1.0000 | LOZENGE | OROMUCOSAL | Status: DC | PRN

## 2012-05-10 MED ORDER — SIMETHICONE 80 MG PO CHEW
80.0000 mg | CHEWABLE_TABLET | Freq: Three times a day (TID) | ORAL | Status: DC
  Administered 2012-05-10 – 2012-05-13 (×10): 80 mg via ORAL

## 2012-05-10 MED ORDER — OXYTOCIN 40 UNITS IN LACTATED RINGERS INFUSION - SIMPLE MED: 62.5000 mL/h | INTRAVENOUS | Status: AC

## 2012-05-10 MED ORDER — NALBUPHINE SYRINGE 5 MG/0.5 ML
INJECTION | INTRAMUSCULAR | Status: AC
  Administered 2012-05-10: 10 mg via SUBCUTANEOUS
  Filled 2012-05-10: qty 1

## 2012-05-10 MED ORDER — CEFAZOLIN SODIUM-DEXTROSE 2-3 GM-% IV SOLR
INTRAVENOUS | Status: AC
  Administered 2012-05-10: 2 g via INTRAVENOUS
  Filled 2012-05-10: qty 50

## 2012-05-10 MED ORDER — LANOLIN HYDROUS EX OINT: 1.0000 "application " | TOPICAL_OINTMENT | CUTANEOUS | Status: DC | PRN

## 2012-05-10 MED ORDER — METOCLOPRAMIDE HCL 5 MG/ML IJ SOLN: 10.0000 mg | Freq: Three times a day (TID) | INTRAMUSCULAR | Status: DC | PRN

## 2012-05-10 MED ORDER — SIMETHICONE 80 MG PO CHEW: 80.0000 mg | CHEWABLE_TABLET | ORAL | Status: DC | PRN

## 2012-05-10 MED ORDER — OXYCODONE-ACETAMINOPHEN 5-325 MG PO TABS
1.0000 | ORAL_TABLET | ORAL | Status: DC | PRN
  Administered 2012-05-11 – 2012-05-12 (×4): 1 via ORAL
  Administered 2012-05-12 – 2012-05-13 (×4): 2 via ORAL
  Filled 2012-05-10: qty 1
  Filled 2012-05-10: qty 2
  Filled 2012-05-10: qty 1
  Filled 2012-05-10 (×2): qty 2
  Filled 2012-05-10: qty 1
  Filled 2012-05-10 (×2): qty 2

## 2012-05-10 MED ORDER — MORPHINE SULFATE 0.5 MG/ML IJ SOLN
INTRAMUSCULAR | Status: AC
  Filled 2012-05-10: qty 10

## 2012-05-10 MED ORDER — MORPHINE SULFATE 10 MG/ML IJ SOLN
INTRAMUSCULAR | Status: AC
  Filled 2012-05-10: qty 1

## 2012-05-10 MED ORDER — NALOXONE HCL 0.4 MG/ML IJ SOLN: 0.4000 mg | INTRAMUSCULAR | Status: DC | PRN

## 2012-05-10 MED ORDER — ONDANSETRON HCL 4 MG/2ML IJ SOLN
INTRAMUSCULAR | Status: DC | PRN
  Administered 2012-05-10: 4 mg via INTRAVENOUS

## 2012-05-10 MED ORDER — HYDROMORPHONE HCL PF 1 MG/ML IJ SOLN: 0.2500 mg | INTRAMUSCULAR | Status: DC | PRN

## 2012-05-10 MED ORDER — SCOPOLAMINE 1 MG/3DAYS TD PT72: 1.0000 | MEDICATED_PATCH | Freq: Once | TRANSDERMAL | Status: DC

## 2012-05-10 MED ORDER — SODIUM CHLORIDE 0.9 % IJ SOLN: 3.0000 mL | INTRAMUSCULAR | Status: DC | PRN

## 2012-05-10 MED ORDER — NALBUPHINE HCL 10 MG/ML IJ SOLN
5.0000 mg | INTRAMUSCULAR | Status: DC | PRN
  Administered 2012-05-10: 10 mg via SUBCUTANEOUS
  Filled 2012-05-10: qty 1

## 2012-05-10 MED ORDER — OXYTOCIN 10 UNIT/ML IJ SOLN
40.0000 [IU] | INTRAVENOUS | Status: DC | PRN
  Administered 2012-05-10: 40 [IU] via INTRAVENOUS

## 2012-05-10 MED ORDER — TETANUS-DIPHTH-ACELL PERTUSSIS 5-2.5-18.5 LF-MCG/0.5 IM SUSP: 0.5000 mL | Freq: Once | INTRAMUSCULAR | Status: DC

## 2012-05-10 MED ORDER — SODIUM CHLORIDE 0.9 % IV SOLN: 1.0000 ug/kg/h | INTRAVENOUS | Status: DC | PRN

## 2012-05-10 MED ORDER — IBUPROFEN 600 MG PO TABS: 600.0000 mg | ORAL_TABLET | Freq: Four times a day (QID) | ORAL | Status: DC | PRN

## 2012-05-10 MED ORDER — KETOROLAC TROMETHAMINE 60 MG/2ML IM SOLN
60.0000 mg | Freq: Once | INTRAMUSCULAR | Status: AC | PRN
  Administered 2012-05-10: 60 mg via INTRAMUSCULAR

## 2012-05-10 MED ORDER — BUPIVACAINE HCL (PF) 0.25 % IJ SOLN
INTRAMUSCULAR | Status: DC | PRN
  Administered 2012-05-10: 30 mL

## 2012-05-10 MED ORDER — WITCH HAZEL-GLYCERIN EX PADS: 1.0000 "application " | MEDICATED_PAD | CUTANEOUS | Status: DC | PRN

## 2012-05-10 MED ORDER — SCOPOLAMINE 1 MG/3DAYS TD PT72
1.0000 | MEDICATED_PATCH | Freq: Once | TRANSDERMAL | Status: DC
  Administered 2012-05-10: 1.5 mg via TRANSDERMAL

## 2012-05-10 MED ORDER — ONDANSETRON HCL 4 MG/2ML IJ SOLN
4.0000 mg | Freq: Three times a day (TID) | INTRAMUSCULAR | Status: DC | PRN
  Filled 2012-05-10: qty 2

## 2012-05-10 SURGICAL SUPPLY — 24 items
CHLORAPREP W/TINT 26ML (MISCELLANEOUS) ×2 IMPLANT
CLIP FILSHIE TUBAL LIGA STRL (Clip) ×2 IMPLANT
CLOTH BEACON ORANGE TIMEOUT ST (SAFETY) ×2 IMPLANT
CONTAINER PREFILL 10% NBF 15ML (MISCELLANEOUS) ×2 IMPLANT
DRSG COVADERM 4X10 (GAUZE/BANDAGES/DRESSINGS) ×2 IMPLANT
ELECT REM PT RETURN 9FT ADLT (ELECTROSURGICAL) ×2
ELECTRODE REM PT RTRN 9FT ADLT (ELECTROSURGICAL) ×1 IMPLANT
EXTRACTOR VACUUM M CUP 4 TUBE (SUCTIONS) ×2 IMPLANT
GLOVE BIO SURGEON STRL SZ 6.5 (GLOVE) ×4 IMPLANT
GOWN PREVENTION PLUS LG XLONG (DISPOSABLE) ×6 IMPLANT
NEEDLE HYPO 22GX1.5 SAFETY (NEEDLE) ×2 IMPLANT
NEEDLE HYPO 25X5/8 SAFETYGLIDE (NEEDLE) ×2 IMPLANT
NS IRRIG 1000ML POUR BTL (IV SOLUTION) ×2 IMPLANT
PACK C SECTION WH (CUSTOM PROCEDURE TRAY) ×2 IMPLANT
PAD OB MATERNITY 4.3X12.25 (PERSONAL CARE ITEMS) ×2 IMPLANT
STAPLER VISISTAT 35W (STAPLE) ×2 IMPLANT
SUT CHROMIC 0 CTX 36 (SUTURE) ×4 IMPLANT
SUT VIC AB 0 CT1 27 (SUTURE) ×3
SUT VIC AB 0 CT1 27XBRD ANBCTR (SUTURE) ×3 IMPLANT
SUT VIC AB 4-0 KS 27 (SUTURE) ×2 IMPLANT
SYRINGE 10CC LL (SYRINGE) ×2 IMPLANT
TOWEL OR 17X24 6PK STRL BLUE (TOWEL DISPOSABLE) ×4 IMPLANT
TRAY FOLEY CATH 14FR (SET/KITS/TRAYS/PACK) ×2 IMPLANT
WATER STERILE IRR 1000ML POUR (IV SOLUTION) ×2 IMPLANT

## 2012-05-10 NOTE — Anesthesia Procedure Notes (Signed)

## 2012-05-10 NOTE — Consult Note (Signed)
Neonatology Note:  Attendance at C-section:  I was asked to attend this repeat C/S at 38 weeks with amniocentesis showing LS ratio 2.6 with PG present. The mother is a G3P2 O pos, GBS neg with an uncomplicated pregnancy. ROM at delivery, fluid clear. Infant vigorous with good spontaneous cry and tone. Needed no bulb suctioning. Ap 9/9. Lungs clear to ausc in DR. To CN to care of Pediatrician.  Fredrica Capano, MD  

## 2012-05-10 NOTE — H&P (Signed)
31 year old G 3 P 2002 at 4 1 presents for Repeat C Section and BTL. Pregnancy complicated by severe lower back pain/ sciatica requiring bedrest and narcotics. She has also developed severe pelvic pain/ contractions resulting in numerous visits to our office and to the hospital. The patient has complained of lower back pain since the first trimester but in the past few weeks it has significantly worsened. At times her legs feel numb especially when she stands. As a result of this, the patient very much desired delivery. She was counseled in our office and elected to undergo amnio prior to C Section. That was performed yesterday and the LS was 2.6 with PG present. She desires permanent sterilization.  PNC Above Previous C Section x 2 NKDA BP slightly elevated General alert and oriented Lung CTAB Car RRR Abdomen gravid Cervix is closed  IMPRESSION: IUP at 38 weeks Previous C Section x 2 Desires Permanent Sterilization Severe Back Pain  PLAN: Repeat LTCS  BTL Patient counseled on the risks of the procedure Consent is signed.

## 2012-05-10 NOTE — Anesthesia Postprocedure Evaluation (Signed)
  Anesthesia Post-op Note  Patient: Deborah [name]  Procedure(s) Performed: Procedure(s) (LRB): CESAREAN SECTION WITH BILATERAL TUBAL LIGATION (Bilateral)   Patient is awake, responsive, moving her legs, and has signs of resolution of her numbness. Pain and nausea are reasonably well controlled. Vital signs are stable and clinically acceptable. Oxygen saturation is clinically acceptable. There are no apparent anesthetic complications at this time. Patient is ready for discharge.

## 2012-05-10 NOTE — Transfer of Care (Signed)
Immediate Anesthesia Transfer of Care Note  Patient: Deborah [name]  Procedure(s) Performed: Procedure(s) (LRB): CESAREAN SECTION WITH BILATERAL TUBAL LIGATION (Bilateral)  Patient Location: PACU  Anesthesia Type: Spinal  Level of Consciousness: awake, alert  and oriented  Airway & Oxygen Therapy: Patient Spontanous Breathing  Post-op Assessment: Report given to PACU RN and Post -op Vital signs reviewed and stable  Post vital signs: stable  Complications: No apparent anesthesia complications

## 2012-05-10 NOTE — Anesthesia Preprocedure Evaluation (Signed)
Anesthesia Evaluation  Patient identified by MRN, date of birth, ID band Patient awake    Reviewed: Allergy & Precautions, H&P , Patient's Chart, lab work & pertinent test results  History of Anesthesia Complications (+) PONV  Airway Mallampati: II TM Distance: >3 FB Neck ROM: full    Dental No notable dental hx.    Pulmonary  breath sounds clear to auscultation  Pulmonary exam normal       Cardiovascular Exercise Tolerance: Good Rhythm:regular Rate:Normal     Neuro/Psych    GI/Hepatic GERD-  ,  Endo/Other    Renal/GU      Musculoskeletal   Abdominal   Peds  Hematology   Anesthesia Other Findings   Reproductive/Obstetrics                           Anesthesia Physical Anesthesia Plan  ASA: III  Anesthesia Plan: Spinal   Post-op Pain Management:    Induction:   Airway Management Planned:   Additional Equipment:   Intra-op Plan:   Post-operative Plan:   Informed Consent: I have reviewed the patients History and Physical, chart, labs and discussed the procedure including the risks, benefits and alternatives for the proposed anesthesia with the patient or authorized representative who has indicated his/her understanding and acceptance.   Dental Advisory Given  Plan Discussed with: CRNA  Anesthesia Plan Comments: (Lab work confirmed with CRNA in room. Platelets okay. Discussed spinal anesthetic, and patient consents to the procedure:  included risk of possible headache,backache, failed block, allergic reaction, and nerve injury. This patient was asked if she had any questions or concerns before the procedure started. )        Anesthesia Quick Evaluation

## 2012-05-10 NOTE — Brief Op Note (Signed)
05/10/2012  1:48 PM  PATIENT:  Deborah [name]  31 y.o. female  PRE-OPERATIVE DIAGNOSIS:  2 Previous Cesarean Sections, Desires Sterilization  POST-OPERATIVE DIAGNOSIS:  2 Previous Cesarean Sections, Desires Sterilization  PROCEDURE:  Procedure(s) (LRB): CESAREAN SECTION WITH BILATERAL TUBAL LIGATION (Bilateral)  SURGEON:  Surgeon(s) and Role:    * Jeani Hawking, MD - Primary  PHYSICIAN ASSISTANT:   ASSISTANTS: none   ANESTHESIA:   spinal  EBL:  Total I/O In: 3200 [I.V.:3200] Out: 700 [Urine:100; Blood:600]  BLOOD ADMINISTERED:none  DRAINS: Urinary Catheter (Foley)   LOCAL MEDICATIONS USED:  NONE  SPECIMEN:  No Specimen  DISPOSITION OF SPECIMEN:  N/A  COUNTS:  YES  TOURNIQUET:  * No tourniquets in log *  DICTATION: .Other Dictation: Dictation Number [id number]  PLAN OF CARE: Admit to inpatient   PATIENT DISPOSITION:  PACU - hemodynamically stable.   Delay start of Pharmacological VTE agent (>24hrs) due to surgical blood loss or risk of bleeding: not applicable

## 2012-05-10 NOTE — Progress Notes (Signed)
History and physical on the chart. No significant changes Will proceed with Repeat LTCS and BTL Consent signed 

## 2012-05-11 LAB — TYPE AND SCREEN
ABO/RH(D): O POS
Antibody Screen: NEGATIVE
Unit division: 0

## 2012-05-11 LAB — CBC
MCH: 28.7 pg (ref 26.0–34.0)
MCHC: 33.3 g/dL (ref 30.0–36.0)
Platelets: 207 10*3/uL (ref 150–400)

## 2012-05-11 LAB — CCBB MATERNAL DONOR DRAW

## 2012-05-11 NOTE — Addendum Note (Signed)
Addendum  created 05/11/12 0754 by Duan Scharnhorst M Leonte Horrigan, CRNA   Modules edited:Notes Section    

## 2012-05-11 NOTE — Anesthesia Postprocedure Evaluation (Signed)
Anesthesia Post Note  Patient: Deborah [name]  Procedure(s) Performed: Procedure(s) (LRB): CESAREAN SECTION WITH BILATERAL TUBAL LIGATION (Bilateral)  Anesthesia type: SAB  Patient location: Mother/Baby  Post pain: Pain level controlled  Post assessment: Post-op Vital signs reviewed  Last Vitals:  Filed Vitals:   05/11/12 0527  BP: 101/68  Pulse: 72  Temp: 36.3 C  Resp: 18    Post vital signs: Reviewed  Level of consciousness: awake  Complications: No apparent anesthesia complications

## 2012-05-11 NOTE — Op Note (Signed)
NAMELICET, DUNPHY               ACCOUNT NO.:  [id number]  MEDICAL RECORD NO.:  [id number]  LOCATION:  9120                          FACILITY:  [facility]  PHYSICIAN:  Rheana Casebolt L. Beckhem Isadore, M.D.DATE OF BIRTH:  1981/09/11  DATE OF PROCEDURE:  05/10/2012 DATE OF DISCHARGE:                              OPERATIVE REPORT   PREOPERATIVE DIAGNOSES:  Intrauterine pregnancy at 38 plus weeks, mature amniocentesis, severe back pain, previous cesarean section x2, and desires permanent sterilization.  POSTOPERATIVE DIAGNOSIS:  Intrauterine pregnancy at 38 plus weeks, mature amniocentesis, severe back pain, previous cesarean section x2, and desires permanent sterilization.  PROCEDURE:  Repeat low transverse cesarean section and bilateral tubal ligation with Filshie clips.  SURGEON:  Iriana Artley L. Vincente Poli, MD  ANESTHESIA:  Spinal.  COMPLICATIONS:  None.  ESTIMATED BLOOD LOSS:  Less than 500 mL.  DRAINS:  Foley.  PROCEDURE IN DETAIL:  The patient was taken to the operating room.  She was then prepped and draped in usual sterile fashion and a Foley catheter was inserted.  A low transverse incision was made, carried down to the fascia.  The fascia was scored in midline extended laterally. Rectus muscle was separated in the midline and the peritoneum was entered bluntly.  There were noted to be some omental adhesions involving the bladder flap which I carefully took down with Metzenbaum scissors.  We then made a low transverse incision in the uterus.  Uterus was entered using a hemostat.  The baby was in cephalic presentation, was delivered easily with a vacuum extractor, was a female infant, Apgars 8 at 1 minute and 9 at 5 minutes.  The cord was clamped and cut. The baby was handed to the awaiting neonatal team and subsequently taken to newborn nursery after appropriate skin intact.  The placenta was manually removed, noted to be normal intact with a three-vessel cord. The uterus was exteriorized  and cleared of all clots and debris.  The uterine incision was closed in 1 layer using 0 chromic in a running, locked stitch.  I then placed Filshie clips across each mid portion of the fallopian tube on either side for the tubal ligation.  Irrigation was performed.  Hemostasis was noted.  The peritoneum was closed using 0 Vicryl.  The rectus muscles were closed using 0 Vicryl starting each corner and meeting in the midline.  After irrigation of the subcutaneous layer, the skin was closed with staples. All sponge, lap, and instrument counts were correct x2.  The patient went to recovery room in stable condition.     Malanie Koloski L. Vincente Poli, M.D.     Florestine Avers  D:  05/10/2012  T:  05/11/2012  Job:  [id number]

## 2012-05-11 NOTE — Progress Notes (Signed)
Subjective: Postpartum Day 1: Cesarean Delivery Patient reports tolerating PO, + flatus and no problems voiding.    Objective: Vital signs in last 24 hours: Temp:  [97.3 F (36.3 C)-98.6 F (37 C)] 97.3 F (36.3 C) (08/23 0527) Pulse Rate:  [72-90] 72  (08/23 0527) Resp:  [16-20] 18  (08/23 0527) BP: (101-141)/(61-87) 101/68 mmHg (08/23 0527) SpO2:  [96 %-100 %] 98 % (08/23 0146) Weight:  [105.688 kg (233 lb)] 105.688 kg (233 lb) (08/22 1703)  Physical Exam:  General: alert and cooperative Lochia: appropriate Uterine Fundus: firm Incision: abd dressing noted with small drainage on bandage. Patient reports h/o infection at incisional site with previous delivery with plan to leave staples in place for 7 days DVT Evaluation: No evidence of DVT seen on physical exam.   Basename 05/11/12 0455 05/09/12 0908  HGB 11.4* 13.8  HCT 34.2* 40.5    Assessment/Plan: Status post Cesarean section. Doing well postoperatively.  Continue current care.  CURTIS,CAROL G 05/11/2012, 8:05 AM

## 2012-05-12 NOTE — Progress Notes (Signed)
Subjective: Postpartum Day 2: Cesarean Delivery Patient reports tolerating PO, + flatus and no problems voiding.    Objective: Vital signs in last 24 hours: Temp:  [98 F (36.7 C)-98.9 F (37.2 C)] 98.9 F (37.2 C) (08/23 2154) Pulse Rate:  [73-94] 89  (08/23 2154) Resp:  [18-20] 20  (08/23 2154) BP: (101-140)/(59-84) 125/84 mmHg (08/23 2154)  Physical Exam:  General: alert, cooperative and appears stated age Lochia: appropriate Uterine Fundus: firm Incision: healing well, no significant drainage, no dehiscence, no significant erythema DVT Evaluation: No evidence of DVT seen on physical exam. Negative Homan's sign. No cords or calf tenderness.   Basename 05/11/12 0455  HGB 11.4*  HCT 34.2*    Assessment/Plan: Status post Cesarean section. Doing well postoperatively. Plan to schedule staple removal appt 7 days postop secondary to history of wound complications with previous C/S. Continue current care.  Deborah [name] 05/12/2012, 9:19 AM

## 2012-05-13 MED ORDER — IBUPROFEN 600 MG PO TABS: 600.0000 mg | ORAL_TABLET | Freq: Four times a day (QID) | ORAL | Status: AC

## 2012-05-13 MED ORDER — OXYCODONE-ACETAMINOPHEN 5-325 MG PO TABS: 1.0000 | ORAL_TABLET | ORAL | Status: AC | PRN

## 2012-05-13 NOTE — Discharge Summary (Signed)
Obstetric Discharge Summary Reason for Admission: cesarean section Prenatal Procedures: amniocentesis Intrapartum Procedures: cesarean: low cervical, transverse and tubal ligation Postpartum Procedures: none Complications-Operative and Postpartum: none Hemoglobin  Date Value Range Status  05/11/2012 11.4* 12.0 - 15.0 g/dL Final     DELTA CHECK NOTED     REPEATED TO VERIFY     HCT  Date Value Range Status  05/11/2012 34.2* 36.0 - 46.0 % Final    Physical Exam:  General: alert, cooperative and appears stated age 31: appropriate Uterine Fundus: firm Incision: healing well, no significant drainage, no dehiscence, no significant erythema DVT Evaluation: No evidence of DVT seen on physical exam. Negative Homan's sign. No cords or calf tenderness. No significant calf/ankle edema.  Discharge Diagnoses: Term Pregnancy-delivered  Discharge Information: Date: 05/13/2012 Activity: unrestricted and pelvic rest Diet: routine Medications: PNV, Ibuprofen, Colace and Percocet Condition: stable Instructions: refer to practice specific booklet.  Follow up in office next week for staple removal. Discharge to: home   Newborn Data: Live born female  Birth Weight: 7 lb 5.6 oz (3335 g) APGAR: 9, 9  Home with mother.  Gorman Safi 05/13/2012, 9:40 AM

## 2012-05-13 NOTE — Plan of Care (Signed)
Problem: Discharge Progression Outcomes Goal: Remove staples per MD order Outcome: Not Applicable Date Met:  05/13/12 To be removed in the office

## 2012-06-22 ENCOUNTER — Other Ambulatory Visit: Payer: Self-pay | Admitting: Obstetrics and Gynecology

## 2012-11-25 ENCOUNTER — Emergency Department (HOSPITAL_BASED_OUTPATIENT_CLINIC_OR_DEPARTMENT_OTHER): Payer: Worker's Compensation

## 2012-11-25 ENCOUNTER — Emergency Department (HOSPITAL_BASED_OUTPATIENT_CLINIC_OR_DEPARTMENT_OTHER)
Admission: EM | Admit: 2012-11-25 | Discharge: 2012-11-25 | Disposition: A | Discharge status: Home / Self Care | Payer: Worker's Compensation | Attending: Emergency Medicine | Admitting: Emergency Medicine

## 2012-11-25 ENCOUNTER — Encounter (HOSPITAL_BASED_OUTPATIENT_CLINIC_OR_DEPARTMENT_OTHER): Payer: Self-pay | Admitting: *Deleted

## 2012-11-25 DIAGNOSIS — S46911A Strain of unspecified muscle, fascia and tendon at shoulder and upper arm level, right arm, initial encounter: Secondary | ICD-10-CM

## 2012-11-25 DIAGNOSIS — S8000XA Contusion of unspecified knee, initial encounter: Secondary | ICD-10-CM | POA: Insufficient documentation

## 2012-11-25 DIAGNOSIS — S5010XA Contusion of unspecified forearm, initial encounter: Secondary | ICD-10-CM | POA: Insufficient documentation

## 2012-11-25 DIAGNOSIS — Y929 Unspecified place or not applicable: Secondary | ICD-10-CM | POA: Insufficient documentation

## 2012-11-25 DIAGNOSIS — IMO0002 Reserved for concepts with insufficient information to code with codable children: Secondary | ICD-10-CM | POA: Insufficient documentation

## 2012-11-25 DIAGNOSIS — W19XXXA Unspecified fall, initial encounter: Secondary | ICD-10-CM

## 2012-11-25 DIAGNOSIS — Y9301 Activity, walking, marching and hiking: Secondary | ICD-10-CM | POA: Insufficient documentation

## 2012-11-25 DIAGNOSIS — W010XXA Fall on same level from slipping, tripping and stumbling without subsequent striking against object, initial encounter: Secondary | ICD-10-CM | POA: Insufficient documentation

## 2012-11-25 DIAGNOSIS — Z8719 Personal history of other diseases of the digestive system: Secondary | ICD-10-CM | POA: Insufficient documentation

## 2012-11-25 NOTE — ED Notes (Signed)
Patient states that she slipped and fell on the ice last Tuesday, has been to the urgent care for pain in R knee and and R arm, was given oxaprozin, but no relief. States that it has grown worse since she started taking this medicine and feels it is difficult to move arm or R leg

## 2012-11-25 NOTE — ED Provider Notes (Signed)
History     CSN: [id number]  Arrival date & time 11/25/12  0909   First MD Initiated Contact with Patient 11/25/12 [phone]      Chief Complaint  Patient presents with  . Fall    (Consider location/radiation/quality/duration/timing/severity/associated sxs/prior treatment) HPI Comments: Patient presents with complaints of pain in "every joint on the right side of my body".  This started after a fall on ice 5 days ago.  She has been seen at [facility] twice for this and no xrays were done.  She was sent home with nsaids and then muscle relaxers but these have not helped.  She now has "less mobility" in her shoulder and leg around the knee.  She also feels as though the right side of her body is "tingly".  No headache or neck pain.  Patient is a 32 y.o. female presenting with fall. The history is provided by the patient.  Fall Incident onset: 5 days ago. The fall occurred while walking (slipped on ice). She fell from a height of 1 to 2 ft. She landed on concrete. There was no blood loss. Point of impact: right side of body. Pain location: entire right side of body. The pain is severe.    Past Medical History  Diagnosis Date  . Gallstones   . Abnormal Pap smear   . PONV (postoperative nausea and vomiting)   . GERD (gastroesophageal reflux disease)     with pregnancy- no meds    Past Surgical History  Procedure Laterality Date  . Cesarean section  2010, 2006  . Cryotherapy    . Cholecystectomy  2010  . Tubal ligation      Family History  Problem Relation Age of Onset  . Kidney disease Mother     History  Substance Use Topics  . Smoking status: Never Smoker   . Smokeless tobacco: Not on file  . Alcohol Use: No    OB History   Grav Para Term Preterm Abortions TAB SAB Ect Mult Living   3 3 3       3       Review of Systems  All other systems reviewed and are negative.    Allergies  Review of patient's allergies indicates no known allergies.  Home Medications   Current  Outpatient Rx  Name  Route  Sig  Dispense  Refill  . oxaprozin (DAYPRO) 600 MG tablet   Oral   Take 600 mg by mouth 2 (two) times daily as needed.         . diphenhydrAMINE (BENADRYL) 25 MG tablet   Oral   Take 25 mg by mouth at bedtime as needed. For sleep         . Prenatal Vit-Fe Fumarate-FA (PRENATAL MULTIVITAMIN) TABS   Oral   Take 1 tablet by mouth every morning.            BP 136/87  Pulse 105  Temp(Src) 99.2 F (37.3 C) (Oral)  Resp 19  SpO2 100%  Physical Exam  Nursing note and vitals reviewed. Constitutional: She is oriented to person, place, and time. She appears well-developed and well-nourished.  Obese female, no acute distress.  HENT:  Head: Normocephalic and atraumatic.  Mouth/Throat: Oropharynx is clear and moist.  Eyes: EOM are normal. Pupils are equal, round, and reactive to light.  Neck: Normal range of motion. Neck supple.  Cardiovascular: Normal rate and regular rhythm.   No murmur heard. Pulmonary/Chest: Effort normal and breath sounds normal. No respiratory distress. She  has no wheezes.  Abdominal: Soft. Bowel sounds are normal.  Musculoskeletal:  The right shoulder, knee, hip, forearm, elbow all appear grossly normal.  There is pain with range of motion of all of these joints but no crepitus or other abnormality.  She is ambulatory without difficulty.  Neurological: She is alert and oriented to person, place, and time. No cranial nerve deficit. Coordination normal.    ED Course  Procedures (including critical care time)  Labs Reviewed - No data to display No results found.   No diagnosis found.    MDM  The xrays are all unremarkable with no evidence for fracture or dislocation.  This is somewhat of a confusing clinical picture, as she is describing pain in the joints on the right side of her body after a fall, now with numbness and tingling of the hand.  There is no neck or head injury or pain.  She appears well and is ambulatory  without difficulty.  She seems appropriate for discharge, to follow up prn.        Geoffery Lyons, MD 11/25/12 1537

## 2013-07-05 ENCOUNTER — Encounter (HOSPITAL_BASED_OUTPATIENT_CLINIC_OR_DEPARTMENT_OTHER): Payer: Self-pay | Admitting: Emergency Medicine

## 2013-07-05 ENCOUNTER — Emergency Department (HOSPITAL_BASED_OUTPATIENT_CLINIC_OR_DEPARTMENT_OTHER)
Admission: EM | Admit: 2013-07-05 | Discharge: 2013-07-05 | Disposition: A | Discharge status: Home / Self Care | Payer: 59 | Attending: Emergency Medicine | Admitting: Emergency Medicine

## 2013-07-05 DIAGNOSIS — Z8719 Personal history of other diseases of the digestive system: Secondary | ICD-10-CM | POA: Insufficient documentation

## 2013-07-05 DIAGNOSIS — Y9389 Activity, other specified: Secondary | ICD-10-CM | POA: Insufficient documentation

## 2013-07-05 DIAGNOSIS — S46909A Unspecified injury of unspecified muscle, fascia and tendon at shoulder and upper arm level, unspecified arm, initial encounter: Secondary | ICD-10-CM | POA: Insufficient documentation

## 2013-07-05 DIAGNOSIS — S4980XA Other specified injuries of shoulder and upper arm, unspecified arm, initial encounter: Secondary | ICD-10-CM | POA: Insufficient documentation

## 2013-07-05 DIAGNOSIS — Y92410 Unspecified street and highway as the place of occurrence of the external cause: Secondary | ICD-10-CM | POA: Insufficient documentation

## 2013-07-05 DIAGNOSIS — S139XXA Sprain of joints and ligaments of unspecified parts of neck, initial encounter: Secondary | ICD-10-CM | POA: Insufficient documentation

## 2013-07-05 DIAGNOSIS — S161XXA Strain of muscle, fascia and tendon at neck level, initial encounter: Secondary | ICD-10-CM

## 2013-07-05 MED ORDER — IBUPROFEN 600 MG PO TABS: 600.0000 mg | ORAL_TABLET | Freq: Four times a day (QID) | ORAL | Status: DC | PRN

## 2013-07-05 MED ORDER — DIAZEPAM 5 MG PO TABS: 5.0000 mg | ORAL_TABLET | Freq: Four times a day (QID) | ORAL | Status: DC | PRN

## 2013-07-05 MED ORDER — OXYCODONE-ACETAMINOPHEN 5-325 MG PO TABS: 1.0000 | ORAL_TABLET | Freq: Four times a day (QID) | ORAL | Status: DC | PRN
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
## 2014-05-19 ENCOUNTER — Telehealth: Payer: Self-pay

## 2014-05-19 NOTE — Telephone Encounter (Signed)
[facility] called needing a prescription faxed over for physical therapy for this patient.  Their phone number [phone] & fax is [phone].

## 2014-05-19 NOTE — Telephone Encounter (Signed)
[facility] called back with a different fax number.  It is [phone]

## 2014-05-20 NOTE — Telephone Encounter (Signed)
Workers comp case

## 2014-07-21 ENCOUNTER — Encounter (HOSPITAL_BASED_OUTPATIENT_CLINIC_OR_DEPARTMENT_OTHER): Payer: Self-pay | Admitting: Emergency Medicine

## 2015-12-14 ENCOUNTER — Encounter (HOSPITAL_BASED_OUTPATIENT_CLINIC_OR_DEPARTMENT_OTHER): Payer: Self-pay | Admitting: *Deleted

## 2015-12-14 NOTE — Progress Notes (Signed)
NPO AFTER MN.  ARRIVE AT 0600.  NEEDS HG AND URINE PREG.  

## 2015-12-17 NOTE — Anesthesia Preprocedure Evaluation (Signed)
Anesthesia Evaluation  Patient identified by MRN, date of birth, ID band Patient awake    Reviewed: Allergy & Precautions, H&P , Patient's Chart, lab work & pertinent test results, reviewed documented beta blocker date and time   Airway Mallampati: II  TM Distance: >3 FB Neck ROM: full    Dental no notable dental hx.    Pulmonary    Pulmonary exam normal breath sounds clear to auscultation       Cardiovascular  Rhythm:regular Rate:Normal     Neuro/Psych    GI/Hepatic   Endo/Other  Morbid obesity  Renal/GU      Musculoskeletal   Abdominal   Peds  Hematology   Anesthesia Other Findings   Reproductive/Obstetrics                             Anesthesia Physical Anesthesia Plan  ASA: III  Anesthesia Plan:    Post-op Pain Management:    Induction: Intravenous  Airway Management Planned: LMA  Additional Equipment:   Intra-op Plan:   Post-operative Plan:   Informed Consent: I have reviewed the patients History and Physical, chart, labs and discussed the procedure including the risks, benefits and alternatives for the proposed anesthesia with the patient or authorized representative who has indicated his/her understanding and acceptance.   Dental Advisory Given and Dental advisory given  Plan Discussed with: CRNA and Surgeon  Anesthesia Plan Comments: (Discussed GA with LMA, possible sore throat, potential need to switch to ETT, N/V, pulmonary aspiration. Questions answered. )        Anesthesia Quick Evaluation

## 2015-12-18 ENCOUNTER — Encounter (HOSPITAL_BASED_OUTPATIENT_CLINIC_OR_DEPARTMENT_OTHER): Payer: Self-pay | Admitting: *Deleted

## 2015-12-18 ENCOUNTER — Ambulatory Visit (HOSPITAL_BASED_OUTPATIENT_CLINIC_OR_DEPARTMENT_OTHER): Payer: 59 | Admitting: Anesthesiology

## 2015-12-18 ENCOUNTER — Ambulatory Visit (HOSPITAL_BASED_OUTPATIENT_CLINIC_OR_DEPARTMENT_OTHER)
Admission: RE | Admit: 2015-12-18 | Discharge: 2015-12-18 | Disposition: A | Discharge status: Home / Self Care | Payer: 59 | Source: Ambulatory Visit | Attending: Obstetrics and Gynecology | Admitting: Obstetrics and Gynecology

## 2015-12-18 ENCOUNTER — Encounter (HOSPITAL_BASED_OUTPATIENT_CLINIC_OR_DEPARTMENT_OTHER)
Admission: RE | Disposition: A | Discharge status: Home / Self Care | Payer: Self-pay | Source: Ambulatory Visit | Attending: Obstetrics and Gynecology

## 2015-12-18 DIAGNOSIS — Z6839 Body mass index (BMI) 39.0-39.9, adult: Secondary | ICD-10-CM | POA: Insufficient documentation

## 2015-12-18 DIAGNOSIS — N920 Excessive and frequent menstruation with regular cycle: Secondary | ICD-10-CM | POA: Insufficient documentation

## 2015-12-18 HISTORY — PX: DILITATION & CURRETTAGE/HYSTROSCOPY WITH HYDROTHERMAL ABLATION: SHX5570

## 2015-12-18 HISTORY — DX: Excessive and frequent menstruation with regular cycle: N92.0

## 2015-12-18 HISTORY — DX: Personal history of other diseases of the female genital tract: Z87.42

## 2015-12-18 HISTORY — DX: Presence of spectacles and contact lenses: Z97.3

## 2015-12-18 HISTORY — DX: Anemia, unspecified: D64.9

## 2015-12-18 LAB — POCT HEMOGLOBIN-HEMACUE: HEMOGLOBIN: 11.7 g/dL — AB (ref 12.0–15.0)

## 2015-12-18 LAB — POCT PREGNANCY, URINE: Preg Test, Ur: NEGATIVE

## 2015-12-18 SURGERY — DILATATION & CURETTAGE/HYSTEROSCOPY WITH HYDROTHERMAL ABLATION
Anesthesia: General | Site: Uterus

## 2015-12-18 MED ORDER — ONDANSETRON HCL 4 MG/2ML IJ SOLN
INTRAMUSCULAR | Status: DC | PRN
  Administered 2015-12-18: 4 mg via INTRAVENOUS

## 2015-12-18 MED ORDER — DEXAMETHASONE SODIUM PHOSPHATE 4 MG/ML IJ SOLN
INTRAMUSCULAR | Status: DC | PRN
  Administered 2015-12-18: 10 mg via INTRAVENOUS

## 2015-12-18 MED ORDER — KETOROLAC TROMETHAMINE 30 MG/ML IJ SOLN
INTRAMUSCULAR | Status: AC
  Filled 2015-12-18: qty 1

## 2015-12-18 MED ORDER — FENTANYL CITRATE (PF) 100 MCG/2ML IJ SOLN
INTRAMUSCULAR | Status: AC
  Filled 2015-12-18: qty 2

## 2015-12-18 MED ORDER — LIDOCAINE HCL (CARDIAC) 20 MG/ML IV SOLN
INTRAVENOUS | Status: DC | PRN
  Administered 2015-12-18: 100 mg via INTRAVENOUS

## 2015-12-18 MED ORDER — FENTANYL CITRATE (PF) 100 MCG/2ML IJ SOLN
INTRAMUSCULAR | Status: DC | PRN
Start: 2015-12-18 — End: 2015-12-18
  Administered 2015-12-18: 25 ug via INTRAVENOUS
  Administered 2015-12-18: 50 ug via INTRAVENOUS
  Administered 2015-12-18: 25 ug via INTRAVENOUS

## 2015-12-18 MED ORDER — DEXAMETHASONE SODIUM PHOSPHATE 10 MG/ML IJ SOLN
INTRAMUSCULAR | Status: AC
  Filled 2015-12-18: qty 1

## 2015-12-18 MED ORDER — SCOPOLAMINE 1 MG/3DAYS TD PT72
MEDICATED_PATCH | TRANSDERMAL | Status: DC | PRN
  Administered 2015-12-18: 1 via TRANSDERMAL

## 2015-12-18 MED ORDER — KETOROLAC TROMETHAMINE 30 MG/ML IJ SOLN
INTRAMUSCULAR | Status: DC | PRN
  Administered 2015-12-18: 30 mg via INTRAVENOUS

## 2015-12-18 MED ORDER — OXYCODONE HCL 5 MG PO TABS
ORAL_TABLET | ORAL | Status: AC
  Filled 2015-12-18: qty 1

## 2015-12-18 MED ORDER — CEFAZOLIN SODIUM-DEXTROSE 2-4 GM/100ML-% IV SOLN
2.0000 g | INTRAVENOUS | Status: AC
  Administered 2015-12-18: 2 g via INTRAVENOUS
  Filled 2015-12-18: qty 100

## 2015-12-18 MED ORDER — LIDOCAINE HCL (CARDIAC) 20 MG/ML IV SOLN
INTRAVENOUS | Status: AC
  Filled 2015-12-18: qty 5

## 2015-12-18 MED ORDER — ACETAMINOPHEN 160 MG/5ML PO SOLN
ORAL | Status: AC
  Filled 2015-12-18: qty 40.6

## 2015-12-18 MED ORDER — CEFAZOLIN SODIUM-DEXTROSE 2-3 GM-% IV SOLR
INTRAVENOUS | Status: AC
  Filled 2015-12-18: qty 50

## 2015-12-18 MED ORDER — MIDAZOLAM HCL 2 MG/2ML IJ SOLN
INTRAMUSCULAR | Status: AC
  Filled 2015-12-18: qty 2

## 2015-12-18 MED ORDER — MIDAZOLAM HCL 5 MG/5ML IJ SOLN
INTRAMUSCULAR | Status: DC | PRN
  Administered 2015-12-18: 2 mg via INTRAVENOUS

## 2015-12-18 MED ORDER — FENTANYL CITRATE (PF) 100 MCG/2ML IJ SOLN
25.0000 ug | INTRAMUSCULAR | Status: DC | PRN
  Administered 2015-12-18 (×3): 25 ug via INTRAVENOUS
  Filled 2015-12-18: qty 1

## 2015-12-18 MED ORDER — METOCLOPRAMIDE HCL 5 MG/ML IJ SOLN
INTRAMUSCULAR | Status: AC
  Filled 2015-12-18: qty 2

## 2015-12-18 MED ORDER — METOCLOPRAMIDE HCL 5 MG/ML IJ SOLN
INTRAMUSCULAR | Status: DC | PRN
  Administered 2015-12-18: 5 mg via INTRAVENOUS

## 2015-12-18 MED ORDER — ONDANSETRON HCL 4 MG/2ML IJ SOLN
INTRAMUSCULAR | Status: AC
Start: 2015-12-18 — End: 2015-12-18
  Filled 2015-12-18: qty 2

## 2015-12-18 MED ORDER — PROPOFOL 10 MG/ML IV BOLUS
INTRAVENOUS | Status: AC
  Filled 2015-12-18: qty 20

## 2015-12-18 MED ORDER — ACETAMINOPHEN 160 MG/5ML PO SOLN
975.0000 mg | Freq: Once | ORAL | Status: AC
  Administered 2015-12-18: 975 mg via ORAL
  Filled 2015-12-18: qty 30.5

## 2015-12-18 MED ORDER — OXYCODONE-ACETAMINOPHEN 10-325 MG PO TABS: 1.0000 | ORAL_TABLET | ORAL | Status: AC | PRN

## 2015-12-18 MED ORDER — SODIUM CHLORIDE 0.9 % IR SOLN
Status: DC | PRN
  Administered 2015-12-18: 3000 mL

## 2015-12-18 MED ORDER — LACTATED RINGERS IV SOLN
INTRAVENOUS | Status: DC
  Administered 2015-12-18 (×2): via INTRAVENOUS
  Filled 2015-12-18: qty 1000

## 2015-12-18 MED ORDER — SCOPOLAMINE 1 MG/3DAYS TD PT72
MEDICATED_PATCH | TRANSDERMAL | Status: AC
  Filled 2015-12-18: qty 1

## 2015-12-18 MED ORDER — OXYCODONE HCL 5 MG PO TABS
5.0000 mg | ORAL_TABLET | Freq: Once | ORAL | Status: AC
  Administered 2015-12-18: 5 mg via ORAL
  Filled 2015-12-18: qty 1

## 2015-12-18 MED ORDER — LIDOCAINE HCL 1 % IJ SOLN
INTRAMUSCULAR | Status: DC | PRN
  Administered 2015-12-18: 10 mL

## 2015-12-18 MED ORDER — PROPOFOL 10 MG/ML IV BOLUS
INTRAVENOUS | Status: DC | PRN
  Administered 2015-12-18: 200 mg via INTRAVENOUS

## 2015-12-18 SURGICAL SUPPLY — 17 items
CATH ROBINSON RED A/P 16FR (CATHETERS) ×3 IMPLANT
COVER BACK TABLE 60X90IN (DRAPES) ×3 IMPLANT
DRAPE HYSTEROSCOPY (DRAPE) ×3 IMPLANT
DRAPE LG THREE QUARTER DISP (DRAPES) ×3 IMPLANT
DRSG TELFA 3X8 NADH (GAUZE/BANDAGES/DRESSINGS) ×3 IMPLANT
GLOVE BIO SURGEON STRL SZ 6.5 (GLOVE) ×4 IMPLANT
GLOVE BIO SURGEONS STRL SZ 6.5 (GLOVE) ×2
GOWN STRL REUS W/ TWL LRG LVL3 (GOWN DISPOSABLE) ×3 IMPLANT
GOWN STRL REUS W/TWL LRG LVL3 (GOWN DISPOSABLE) ×6
KIT ROOM TURNOVER WOR (KITS) ×3 IMPLANT
LEGGING LITHOTOMY PAIR STRL (DRAPES) ×3 IMPLANT
NEEDLE SPNL 25GX3.5 QUINCKE BL (NEEDLE) ×3 IMPLANT
PACK BASIN DAY SURGERY FS (CUSTOM PROCEDURE TRAY) ×3 IMPLANT
SET GENESYS HTA PROCERVA (MISCELLANEOUS) ×3 IMPLANT
SYR CONTROL 10ML LL (SYRINGE) ×3 IMPLANT
TOWEL OR 17X24 6PK STRL BLUE (TOWEL DISPOSABLE) ×6 IMPLANT
TRAY DSU PREP LF (CUSTOM PROCEDURE TRAY) ×3 IMPLANT

## 2015-12-18 NOTE — Op Note (Signed)
NAMJacqulynn Cadet:  [name], Deborah [name]               ACCOUNT NO.:  [id number]  MEDICAL RECORD NO.:  [id number]  LOCATION:                                 FACILITY:  PHYSICIAN:  Ellean Firman L. Vincente PoliGrewal, M.D.    DATE OF BIRTH:  DATE OF PROCEDURE:  12/18/2015 DATE OF DISCHARGE:                              OPERATIVE REPORT   PREOPERATIVE DIAGNOSIS:  Menorrhagia.  POSTOPERATIVE DIAGNOSIS:  Menorrhagia.  PROCEDURE:  D and C, hysteroscopy, and HTA.  SURGEON:  Aneesa Romey L. Vincente PoliGrewal, MD  ANESTHESIA:  MAC with paracervical block.  EBL:  Minimal.  COMPLICATIONS:  None.  PATHOLOGY:  Uterine curettings.  PROCEDURE IN DETAIL:  Patient was taken to the operating room.  Consent was obtained.  She was prepped and draped in usual sterile fashion.  In- and-out catheter was used to empty the bladder.  The speculum was then turned to the vagina.  The cervix was grasped with a tenaculum and a paracervical block was performed in standard fashion.  A sharp curette was inserted after the cervical os was gently dilated and a thorough uterine curettage was performed.  The hysteroscope with the HTA was inserted and with excellent visualization, I could see that she did not have a uterine window.  The HTA was then initiated.  There was a 0 mL fluid leak.  We then performed HTA for 10 minutes.  At the end of the procedure, the intact instrument was removed.  No bleeding was noted. The patient tolerated procedure well, went to recovery room in stable condition.     Swan Zayed L. Vincente PoliGrewal, M.D.     Florestine AversMLG/MEDQ  D:  12/18/2015  T:  12/18/2015  Job:  [id number]

## 2015-12-18 NOTE — Discharge Instructions (Signed)
° °  D & C Home care Instructions:   Personal hygiene:  Used sanitary napkins for vaginal drainage not tampons. Shower or tub bathe the day after your procedure. No douching until bleeding stops. Always wipe from front to back after  Elimination.  Activity: Do not drive or operate any equipment today. The effects of the anesthesia are still present and drowsiness may result. Rest today, not necessarily flat bed rest, just take it easy. You may resume your normal activity in one to 2 days.  Sexual activity: No intercourse for one week or as indicated by your physician  Diet: Eat a light diet as desired this evening. You may resume a regular diet tomorrow.  Return to work: One to 2 days.  General Expectations of your surgery: Vaginal bleeding should be no heavier than a normal period. Spotting may continue up to 10 days. Mild cramps may continue for a couple of days. You may have a regular period in 2-6 weeks.  Unexpected observations call your doctor if these occur: persistent or heavy bleeding. Severe abdominal cramping or pain. Elevation of temperature greater than 100F.  Call for an appointment in one-two weeks    Patient's Signature_______________________________________________________  Nurse's Signature________________________________________________________   Post Anesthesia Home Care Instructions  Activity: Get plenty of rest for the remainder of the day. A responsible adult should stay with you for 24 hours following the procedure.  For the next 24 hours, DO NOT: -Drive a car -Advertising copywriterperate machinery -Drink alcoholic beverages -Take any medication unless instructed by your physician -Make any legal decisions or sign important papers.  Meals: Start with liquid foods such as gelatin or soup. Progress to regular foods as tolerated. Avoid greasy, spicy, heavy foods. If nausea and/or vomiting occur, drink only clear liquids until the nausea and/or vomiting subsides. Call your  physician if vomiting continues.  Special Instructions/Symptoms: Your throat may feel dry or sore from the anesthesia or the breathing tube placed in your throat during surgery. If this causes discomfort, gargle with warm salt water. The discomfort should disappear within 24 hours.  If you had a scopolamine patch placed behind your ear for the management of post- operative nausea and/or vomiting:  1. The medication in the patch is effective for 72 hours, after which it should be removed.  Wrap patch in a tissue and discard in the trash. Wash hands thoroughly with soap and water. 2. You may remove the patch earlier than 72 hours if you experience unpleasant side effects which may include dry mouth, dizziness or visual disturbances. 3. Avoid touching the patch. Wash your hands with soap and water after contact with the patch.

## 2015-12-18 NOTE — Progress Notes (Signed)
35 year old G 3 P 3 with history of BTL for D and C, HSC, HTA. She has had 3 C Sections.  Uterine scar measures 3.8 mm  Patient has menorrhagia. Was anemic - low as 9.5  BP 131/76 mmHg  Pulse 83  Temp(Src) 99.1 F (37.3 C) (Oral)  Resp 16  Ht 5\' 3"  (1.6 m)  Wt 99.791 kg (220 lb)  BMI 38.98 kg/m2  SpO2 100%  LMP 12/07/2015 (Exact Date) Past Medical History  Diagnosis Date  . PONV (postoperative nausea and vomiting)   . History of abnormal cervical Pap smear   . Menorrhagia   . Anemia   . Wears glasses    Past Surgical History  Procedure Laterality Date  . Cesarean section  01-04-2009  &  07-23-2003  . Cryotherapy    . Cesarean section w/btl  05-10-2012  . Laparoscopic cholecystectomy  03-16-2009   Prior to Admission medications   Medication Sig Start Date End Date Taking? Authorizing Provider  ferrous sulfate 325 (65 FE) MG tablet Take 325 mg by mouth daily.   Yes Historical Provider, MD   Allergies: NKDA  General alert and oriented Lung CTAB Car RRR Abdomen is soft and non tender SHG is negative  IMPRESSION: Menorrhagia  PLAN: D and C, HSC, HTA If no uterine window Risks reviewed Consent signed

## 2015-12-18 NOTE — Anesthesia Procedure Notes (Signed)
Procedure Name: LMA Insertion Date/Time: 12/18/2015 7:43 AM Performed by: Norva PavlovALLAWAY, Xayla Puzio G Pre-anesthesia Checklist: Patient identified, Emergency Drugs available, Suction available and Patient being monitored Patient Re-evaluated:Patient Re-evaluated prior to inductionOxygen Delivery Method: Circle System Utilized Preoxygenation: Pre-oxygenation with 100% oxygen Intubation Type: IV induction Ventilation: Mask ventilation without difficulty LMA: LMA inserted LMA Size: 4.0 Number of attempts: 1 Airway Equipment and Method: bite block Placement Confirmation: positive ETCO2 Tube secured with: Tape Dental Injury: Teeth and Oropharynx as per pre-operative assessment

## 2015-12-18 NOTE — Transfer of Care (Signed)
  Last Vitals:  Filed Vitals:   12/18/15 0609 12/18/15 0824  BP: 131/76 144/95  Pulse: 83 92  Temp: 37.3 C 37.1 C  Resp: 16 20   Immediate Anesthesia Transfer of Care Note  Patient: Deborah [name]  Procedure(s) Performed: Procedure(s) (LRB): DILATATION & CURETTAGE/HYSTEROSCOPY WITH HYDROTHERMAL ABLATION (N/A)  Patient Location: PACU  Anesthesia Type: General  Level of Consciousness: awake, alert  and oriented  Airway & Oxygen Therapy: Patient Spontanous Breathing and Patient connected to nasal oxygen  Post-op Assessment: Report given to PACU RN and Post -op Vital signs reviewed and stable  Post vital signs: Reviewed and stable  Complications: No apparent anesthesia complications

## 2015-12-18 NOTE — Anesthesia Postprocedure Evaluation (Signed)
Anesthesia Post Note  Patient: Deborah [name]  Procedure(s) Performed: Procedure(s) (LRB): DILATATION & CURETTAGE/HYSTEROSCOPY WITH HYDROTHERMAL ABLATION (N/A)  Patient location during evaluation: PACU Anesthesia Type: General Level of consciousness: sedated Pain management: satisfactory to patient Vital Signs Assessment: post-procedure vital signs reviewed and stable Respiratory status: spontaneous breathing Cardiovascular status: stable Anesthetic complications: no    Last Vitals:  Filed Vitals:   12/18/15 0930 12/18/15 0959  BP: 133/91 128/94  Pulse: 77 68  Temp:  36.4 C  Resp: 17 18    Last Pain:  Filed Vitals:   12/18/15 1006  PainSc: 7                  [name] EDWARD

## 2015-12-18 NOTE — Brief Op Note (Signed)
12/18/2015  8:14 AM  PATIENT:  Deborah [name]  35 y.o. female  PRE-OPERATIVE DIAGNOSIS:  menorrhagia  POST-OPERATIVE DIAGNOSIS:  menorrhagia  PROCEDURE:  Procedure(s): DILATATION & CURETTAGE/HYSTEROSCOPY WITH HYDROTHERMAL ABLATION (N/A)  SURGEON:  Surgeon(s) and Role:    * Marcelle OverlieMichelle Lucciano Vitali, MD - Primary  PHYSICIAN ASSISTANT:   ASSISTANTS: none   ANESTHESIA:   paracervical block and MAC  EBL:     BLOOD ADMINISTERED:none  DRAINS: none   LOCAL MEDICATIONS USED:  LIDOCAINE   SPECIMEN:  Source of Specimen:  uterine currettings  DISPOSITION OF SPECIMEN:  PATHOLOGY  COUNTS:  YES  TOURNIQUET:  * No tourniquets in log *  DICTATION: .Other Dictation: Dictation Number [id number]  PLAN OF CARE: Discharge to home after PACU  PATIENT DISPOSITION:  PACU - hemodynamically stable.   Delay start of Pharmacological VTE agent (>24hrs) due to surgical blood loss or risk of bleeding: not applicable

## 2015-12-21 ENCOUNTER — Encounter (HOSPITAL_BASED_OUTPATIENT_CLINIC_OR_DEPARTMENT_OTHER): Payer: Self-pay | Admitting: Obstetrics and Gynecology

## 2018-04-02 ENCOUNTER — Other Ambulatory Visit: Payer: Self-pay

## 2018-04-02 ENCOUNTER — Emergency Department (HOSPITAL_BASED_OUTPATIENT_CLINIC_OR_DEPARTMENT_OTHER)
Admission: EM | Admit: 2018-04-02 | Discharge: 2018-04-02 | Disposition: A | Discharge status: Home / Self Care | Payer: 59 | Attending: Emergency Medicine | Admitting: Emergency Medicine

## 2018-04-02 ENCOUNTER — Encounter (HOSPITAL_BASED_OUTPATIENT_CLINIC_OR_DEPARTMENT_OTHER): Payer: Self-pay | Admitting: *Deleted

## 2018-04-02 ENCOUNTER — Emergency Department (HOSPITAL_BASED_OUTPATIENT_CLINIC_OR_DEPARTMENT_OTHER): Payer: 59

## 2018-04-02 DIAGNOSIS — K5730 Diverticulosis of large intestine without perforation or abscess without bleeding: Secondary | ICD-10-CM | POA: Diagnosis not present

## 2018-04-02 DIAGNOSIS — K5792 Diverticulitis of intestine, part unspecified, without perforation or abscess without bleeding: Secondary | ICD-10-CM

## 2018-04-02 DIAGNOSIS — Z79899 Other long term (current) drug therapy: Secondary | ICD-10-CM | POA: Insufficient documentation

## 2018-04-02 DIAGNOSIS — R1032 Left lower quadrant pain: Secondary | ICD-10-CM | POA: Insufficient documentation

## 2018-04-02 LAB — URINALYSIS, ROUTINE W REFLEX MICROSCOPIC
Bilirubin Urine: NEGATIVE
Glucose, UA: NEGATIVE mg/dL
Hgb urine dipstick: NEGATIVE
KETONES UR: NEGATIVE mg/dL
LEUKOCYTES UA: NEGATIVE
Nitrite: NEGATIVE
PH: 6 (ref 5.0–8.0)
Protein, ur: NEGATIVE mg/dL
Specific Gravity, Urine: <1.005 — ABNORMAL LOW (ref 1.005–1.030)

## 2018-04-02 LAB — COMPREHENSIVE METABOLIC PANEL
ALT: 27 U/L (ref 0–44)
AST: 20 U/L (ref 15–41)
Albumin: 3.8 g/dL (ref 3.5–5.0)
Alkaline Phosphatase: 77 U/L (ref 38–126)
Anion gap: 11 (ref 5–15)
BUN: 9 mg/dL (ref 6–20)
CHLORIDE: 104 mmol/L (ref 98–111)
CO2: 23 mmol/L (ref 22–32)
CREATININE: 0.72 mg/dL (ref 0.44–1.00)
Calcium: 8.6 mg/dL — ABNORMAL LOW (ref 8.9–10.3)
GFR calc Af Amer: >60 mL/min (ref >60)
GLUCOSE: 93 mg/dL (ref 70–99)
Potassium: 3.7 mmol/L (ref 3.5–5.1)
SODIUM: 138 mmol/L (ref 135–145)
Total Bilirubin: 0.4 mg/dL (ref 0.3–1.2)
Total Protein: 7.6 g/dL (ref 6.5–8.1)

## 2018-04-02 LAB — CBC
HCT: 40.5 % (ref 36.0–46.0)
Hemoglobin: 13.3 g/dL (ref 12.0–15.0)
MCH: 26.3 pg (ref 26.0–34.0)
MCHC: 32.8 g/dL (ref 30.0–36.0)
MCV: 80.2 fL (ref 78.0–100.0)
PLATELETS: 333 10*3/uL (ref 150–400)
RBC: 5.05 MIL/uL (ref 3.87–5.11)
RDW: 15.1 % (ref 11.5–15.5)
WBC: 11.8 10*3/uL — ABNORMAL HIGH (ref 4.0–10.5)

## 2018-04-02 LAB — LIPASE, BLOOD: Lipase: 25 U/L (ref 11–51)

## 2018-04-02 LAB — PREGNANCY, URINE: Preg Test, Ur: NEGATIVE

## 2018-04-02 MED ORDER — CIPROFLOXACIN HCL 500 MG PO TABS: 500.0000 mg | ORAL_TABLET | Freq: Two times a day (BID) | ORAL | 0 refills | Status: AC

## 2018-04-02 MED ORDER — METRONIDAZOLE 500 MG PO TABS
500.0000 mg | ORAL_TABLET | Freq: Once | ORAL | Status: AC
  Administered 2018-04-02: 500 mg via ORAL
  Filled 2018-04-02: qty 1

## 2018-04-02 MED ORDER — IOPAMIDOL (ISOVUE-300) INJECTION 61%
100.0000 mL | Freq: Once | INTRAVENOUS | Status: AC | PRN
  Administered 2018-04-02: 100 mL via INTRAVENOUS

## 2018-04-02 MED ORDER — ONDANSETRON HCL 4 MG/2ML IJ SOLN
4.0000 mg | Freq: Once | INTRAMUSCULAR | Status: AC
  Administered 2018-04-02: 4 mg via INTRAVENOUS
  Filled 2018-04-02: qty 2

## 2018-04-02 MED ORDER — CIPROFLOXACIN HCL 500 MG PO TABS
500.0000 mg | ORAL_TABLET | Freq: Once | ORAL | Status: AC
  Administered 2018-04-02: 500 mg via ORAL
  Filled 2018-04-02: qty 1

## 2018-04-02 MED ORDER — IOPAMIDOL (ISOVUE-300) INJECTION 61%
30.0000 mL | Freq: Once | INTRAVENOUS | Status: AC | PRN
  Administered 2018-04-02: 30 mL via ORAL

## 2018-04-02 MED ORDER — METRONIDAZOLE 500 MG PO TABS: 500.0000 mg | ORAL_TABLET | Freq: Three times a day (TID) | ORAL | 0 refills | Status: AC

## 2018-04-02 MED ORDER — MORPHINE SULFATE (PF) 4 MG/ML IV SOLN
4.0000 mg | Freq: Once | INTRAVENOUS | Status: AC
  Administered 2018-04-02: 4 mg via INTRAVENOUS
  Filled 2018-04-02: qty 1

## 2018-04-02 MED ORDER — SODIUM CHLORIDE 0.9 % IV BOLUS
1000.0000 mL | Freq: Once | INTRAVENOUS | Status: AC
  Administered 2018-04-02: 1000 mL via INTRAVENOUS

## 2018-04-02 NOTE — ED Provider Notes (Signed)
[facility] EMERGENCY DEPARTMENT Provider Note   CSN: [id number] Arrival date & time: 04/02/18  1038     History   Chief Complaint Chief Complaint  Patient presents with  . Abdominal Pain    HPI Deborah [name] is a 37 y.o. female.  HPI   Deborah [name] is a 37 year old female with a history of anemia secondary to menorrhagia who presents to the emergency department for evaluation of left-sided abdominal pain.  Patient reports that her pain began gradually yesterday afternoon.  She reports the pain comes and goes in waves without apparent trigger.  She reports pain feels dull and throbbing in nature and is most severe in the left lower quadrant but does occasionally radiate to the right side.  She reports pain is about a 7/10 in severity currently.  She has not taken any medications for pain prior to arrival.  Laying flat and still seems to improve the pain.  She also has associated nausea, no vomiting.  Had a bowel movement earlier today which was normal, no diarrhea, hematochezia or melena.  Had chills last night.  Reports pain is so bad that it makes her feel short of breath.  She has had 3 C-sections and a cholecystectomy, no other prior abdominal surgeries.  She denies vaginal discharge, dysuria, urinary frequency, urinary urgency, obstipation, chest pain, cough, sore throat, congestion, lightheadedness or syncope.  Is concerned that she may have diverticulitis given she has a family history of this.  Has never had pain like this and has never had a colonoscopy.  Last menstrual period 6/20.  Past Medical History:  Diagnosis Date  . Anemia   . History of abnormal cervical Pap smear   . Menorrhagia   . PONV (postoperative nausea and vomiting)   . Wears glasses     There are no active problems to display for this patient.   Past Surgical History:  Procedure Laterality Date  . CESAREAN SECTION  01-04-2009  &  07-23-2003  . CESAREAN SECTION W/BTL  05-10-2012  .  CRYOTHERAPY    . DILITATION & CURRETTAGE/HYSTROSCOPY WITH HYDROTHERMAL ABLATION N/A 12/18/2015   Procedure: DILATATION & CURETTAGE/HYSTEROSCOPY WITH HYDROTHERMAL ABLATION;  Surgeon: Marcelle Overlie, MD;  Location: [facility];  Service: Gynecology;  Laterality: N/A;  . LAPAROSCOPIC CHOLECYSTECTOMY  03-16-2009     OB History    Gravida  3   Para  3   Term  3   Preterm      AB      Living  3     SAB      TAB      Ectopic      Multiple      Live Births  1            Home Medications    Prior to Admission medications   Medication Sig Start Date End Date Taking? Authorizing Provider  ferrous sulfate 325 (65 FE) MG tablet Take 325 mg by mouth daily.    [provider]  oxyCODONE-acetaminophen (PERCOCET) 10-325 MG tablet Take 1 tablet by mouth every 4 (four) hours as needed for pain. 12/18/15   Marcelle Overlie, MD    Family History Family History  Problem Relation Age of Onset  . Kidney disease Mother     Social History Social History   Tobacco Use  . Smoking status: Never Smoker  . Smokeless tobacco: Never Used  Substance Use Topics  . Alcohol use: Yes    Comment: occasional  .  Drug use: No     Allergies   Patient has no known allergies.   Review of Systems Review of Systems  Constitutional: Positive for chills. Negative for fever.  HENT: Negative for congestion, rhinorrhea and sore throat.   Eyes: Negative for visual disturbance.  Respiratory: Negative for cough.   Cardiovascular: Negative for chest pain.  Gastrointestinal: Positive for abdominal pain and nausea. Negative for anal bleeding, blood in stool, diarrhea and vomiting. Abdominal distention: llq.  Genitourinary: Negative for difficulty urinating, dysuria, frequency, hematuria, vaginal bleeding and vaginal discharge.  Musculoskeletal: Negative for back pain and gait problem.  Skin: Negative for rash.  Neurological: Negative for syncope and light-headedness.    Psychiatric/Behavioral: Negative for agitation.    Physical Exam Updated Vital Signs BP (!) 135/100   Pulse 78   Temp 98.6 F (37 C) (Oral)   Resp 17   Ht 5\' 3"  (1.6 m)   Wt 106.6 kg (235 lb)   LMP 03/08/2018   SpO2 100%   BMI 41.63 kg/m   Physical Exam  Constitutional: She is oriented to person, place, and time. She appears well-developed and well-nourished. No distress.  Nontoxic-appearing, no acute distress.  HENT:  Head: Normocephalic and atraumatic.  Mouth/Throat: Oropharynx is clear and moist. No oropharyngeal exudate.  Mucous memories moist.  Eyes: Pupils are equal, round, and reactive to light. Conjunctivae are normal. Right eye exhibits no discharge. Left eye exhibits no discharge.  Neck: Normal range of motion. Neck supple.  Cardiovascular: Normal rate, regular rhythm and intact distal pulses.  No murmur heard. Pulmonary/Chest: Effort normal and breath sounds normal. No stridor. No respiratory distress. She has no wheezes. She has no rales.  Abdominal:  Abdomen soft and nondistended.  Bowel sounds normoactive in all 4 quadrants.  Acutely tender to palpation in the left lower quadrant.  No guarding or rigidity.  No CVA tenderness.  Musculoskeletal: Normal range of motion.  Neurological: She is alert and oriented to person, place, and time. Coordination normal.  Skin: Skin is warm and dry. Capillary refill takes less than 2 seconds. She is not diaphoretic.  Psychiatric: She has a normal mood and affect. Her behavior is normal.  Nursing note and vitals reviewed.    ED Treatments / Results  Labs (all labs ordered are listed, but only abnormal results are displayed) Labs Reviewed  URINALYSIS, ROUTINE W REFLEX MICROSCOPIC - Abnormal; Notable for the following components:      Result Value   Specific Gravity, Urine <1.005 (*)    All other components within normal limits  CBC - Abnormal; Notable for the following components:   WBC 11.8 (*)    All other components  within normal limits  COMPREHENSIVE METABOLIC PANEL - Abnormal; Notable for the following components:   Calcium 8.6 (*)    All other components within normal limits  PREGNANCY, URINE  LIPASE, BLOOD    EKG None  Radiology Ct Abdomen Pelvis W Contrast  Result Date: 04/02/2018 CLINICAL DATA:  Left lower quadrant abdominal pain since yesterday. Nausea, diarrhea, constipation. EXAM: CT ABDOMEN AND PELVIS WITH CONTRAST TECHNIQUE: Multidetector CT imaging of the abdomen and pelvis was performed using the standard protocol following bolus administration of intravenous contrast. CONTRAST:  ISOVUE-300 IOPAMIDOL (ISOVUE-300) INJECTION 61%, 30mL ISOVUE-300 IOPAMIDOL (ISOVUE-300) INJECTION 61% COMPARISON:  None. FINDINGS: Lower chest: Lung bases essentially clear.  Heart normal in size. Hepatobiliary: No focal liver abnormality is seen. Status post cholecystectomy. No biliary dilatation. Pancreas: Unremarkable. No pancreatic ductal dilatation or surrounding inflammatory  changes. Spleen: Normal in size without focal abnormality. Adrenals/Urinary Tract: Adrenal glands are unremarkable. Kidneys are normal, without renal calculi, focal lesion, or hydronephrosis. Bladder is unremarkable. Stomach/Bowel: Wall thickening and adjacent inflammation is noted along a 4 cm segment of the proximal descending colon where there are several diverticula. No extraluminal air. No discrete fluid collection is seen to suggest abscess. There are additional diverticula along the left colon with no other inflammation. Colon otherwise unremarkable. Stomach and small bowel within normal limits. Normal appendix visualized. Vascular/Lymphatic: No significant vascular findings are present. No enlarged abdominal or pelvic lymph nodes. Reproductive: Uterus and bilateral adnexa are unremarkable. Other: No abdominal wall hernia or abnormality. No abdominopelvic ascites. Musculoskeletal: No fracture or acute finding. No osteoblastic or  osteolytic lesions. Disc degenerative changes at L5-S1. IMPRESSION: 1. Acute uncomplicated diverticulitis of the proximal descending colon. No extraluminal air. No abscess. 2. No other acute findings. Electronically Signed   By: Amie Portlandavid  Ormond M.D.   On: 04/02/2018 14:28    Procedures Procedures (including critical care time)  Medications Ordered in ED Medications  sodium chloride 0.9 % bolus 1,000 mL (0 mLs Intravenous Stopped 04/02/18 1251)  morphine 4 MG/ML injection 4 mg (4 mg Intravenous Given 04/02/18 1157)  ondansetron (ZOFRAN) injection 4 mg (4 mg Intravenous Given 04/02/18 1157)  iopamidol (ISOVUE-300) 61 % injection 30 mL (30 mLs Oral Contrast Given 04/02/18 1215)  iopamidol (ISOVUE-300) 61 % injection 100 mL (100 mLs Intravenous Contrast Given 04/02/18 1406)  ciprofloxacin (CIPRO) tablet 500 mg (500 mg Oral Given 04/02/18 1523)  metroNIDAZOLE (FLAGYL) tablet 500 mg (500 mg Oral Given 04/02/18 1523)     Initial Impression / Assessment and Plan / ED Course  I have reviewed the triage vital signs and the nursing notes.  Pertinent labs & imaging results that were available during my care of the patient were reviewed by me and considered in my medical decision making (see chart for details).     CT abdomen/pelvis with diverticulitis. No abscess or evidence of perforation. Patient's VSS, no signs of dehydration. Patient will be started on cipro and flagyl. Received her first dose in the ED. We spoke about advancing her diet slowly. She is able to tolerate po fluids at the bedside. Counseled her on reasons to return to the ED and she agrees.   Final Clinical Impressions(s) / ED Diagnoses   Final diagnoses:  Diverticulitis    ED Discharge Orders        Ordered    ciprofloxacin (CIPRO) 500 MG tablet  Every 12 hours     04/02/18 1514    metroNIDAZOLE (FLAGYL) 500 MG tablet  3 times daily     04/02/18 1514       Lawrence MarseillesShrosbree, Janeice Stegall J, PA-C 04/04/18 0944    Little, Ambrose Finlandachel Morgan,  MD 04/05/18 [phone]

## 2018-04-02 NOTE — ED Notes (Signed)
ED Provider at bedside discussing test results and dispo plan of care. 

## 2018-04-02 NOTE — Discharge Instructions (Addendum)
You have diverticulitis.  Please take antibiotics as prescribed until they are completely gone.  Start with clear liquid diet and slowly graduate to bland foods.  Avoid spicy or greasy foods.  Return to the ER if you have any new or concerning symptoms like fever greater than 100.4 F, vomiting that will not stop, worsening abdominal pain.

## 2018-04-02 NOTE — ED Triage Notes (Signed)
Pain in her left abdomen since yesterday. Pain comes and goes. She had a BM this am but pain did not improve.

## 2021-03-09 ENCOUNTER — Other Ambulatory Visit: Payer: Self-pay | Admitting: Physician Assistant

## 2021-03-09 DIAGNOSIS — Z1231 Encounter for screening mammogram for malignant neoplasm of breast: Secondary | ICD-10-CM

## 2021-05-13 ENCOUNTER — Other Ambulatory Visit: Payer: Self-pay

## 2021-05-13 ENCOUNTER — Ambulatory Visit
Admission: RE | Admit: 2021-05-13 | Discharge: 2021-05-13 | Disposition: A | Discharge status: Home / Self Care | Payer: 59 | Source: Ambulatory Visit | Attending: Physician Assistant | Admitting: Physician Assistant

## 2021-05-13 DIAGNOSIS — Z1231 Encounter for screening mammogram for malignant neoplasm of breast: Secondary | ICD-10-CM

## 2021-09-29 ENCOUNTER — Other Ambulatory Visit: Payer: Self-pay | Admitting: Obstetrics and Gynecology

## 2022-10-20 IMAGING — MG MM DIGITAL SCREENING BILAT W/ TOMO AND CAD
8 series · 8 of 24 positions shown · non-contrast
Comparison: None.

CLINICAL DATA: Screening.

EXAM:
DIGITAL SCREENING BILATERAL MAMMOGRAM WITH TOMOSYNTHESIS AND CAD
TECHNIQUE: Bilateral screening digital craniocaudal and mediolateral oblique
mammograms were obtained. Bilateral screening digital breast
tomosynthesis was performed. The images were evaluated with
computer-aided detection.

[L MLO synth-2D]
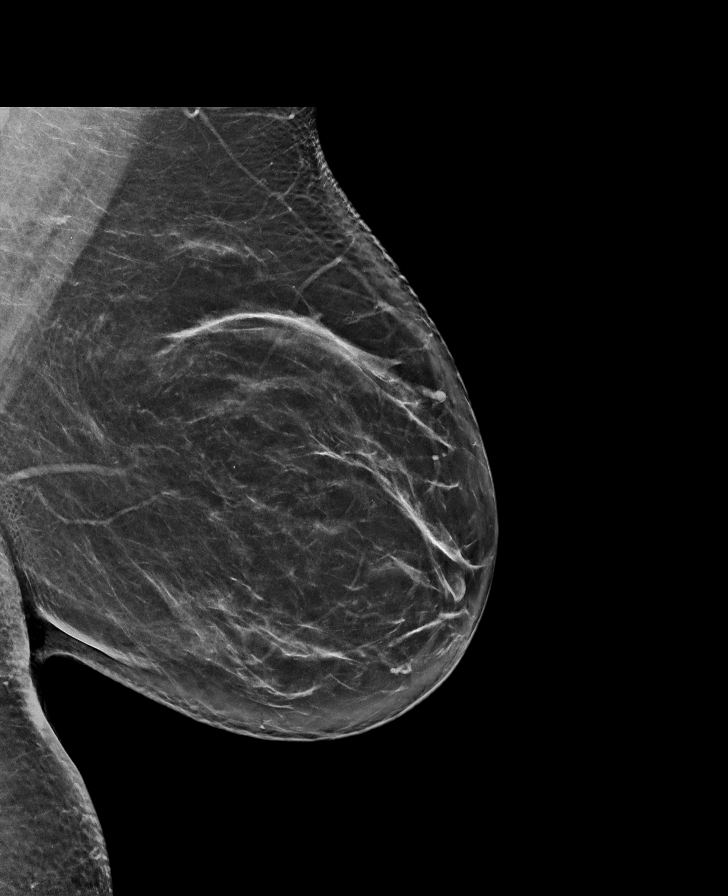

[L CC synth-2D]
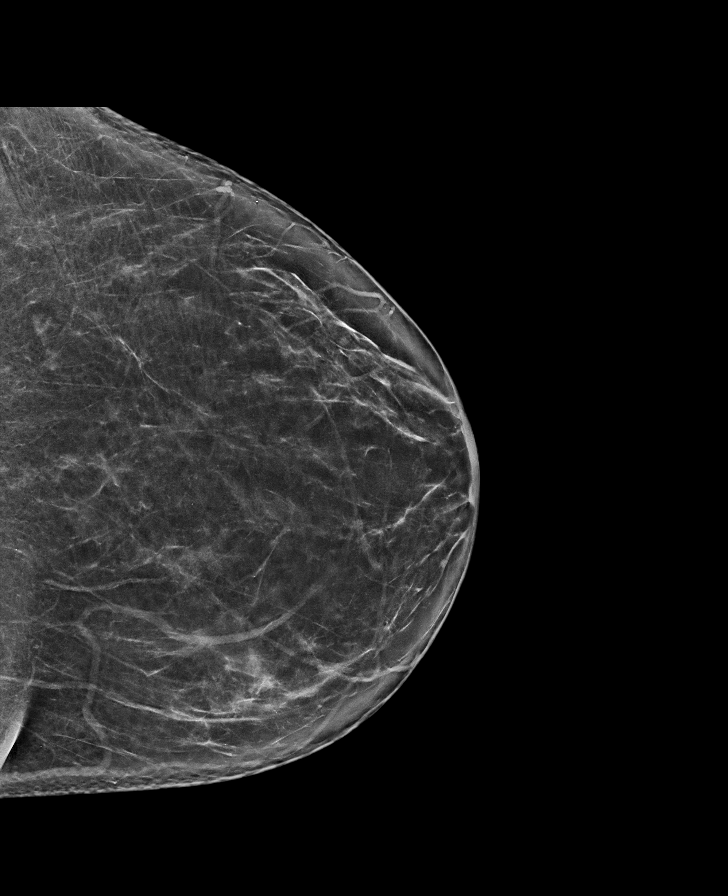

[R MLO synth-2D]
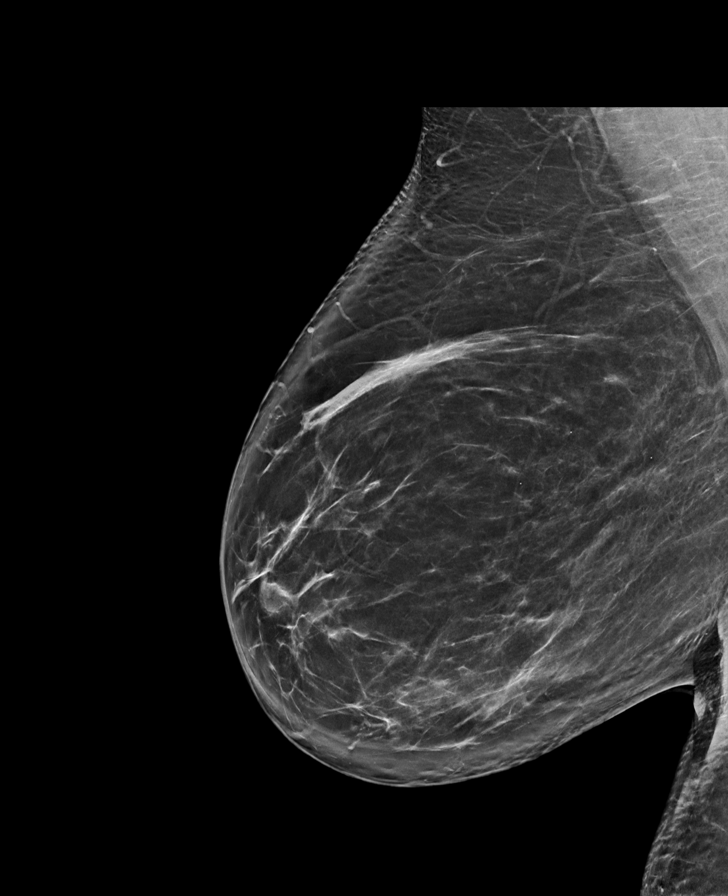

[R CC synth-2D]
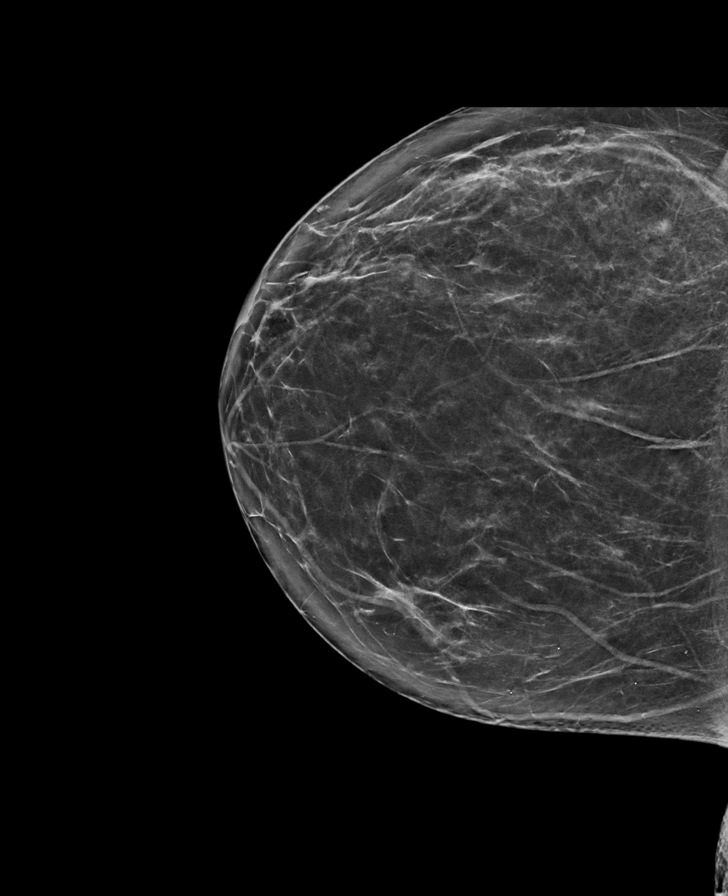

[R MLO tomo · tomo slice 48/95.0]
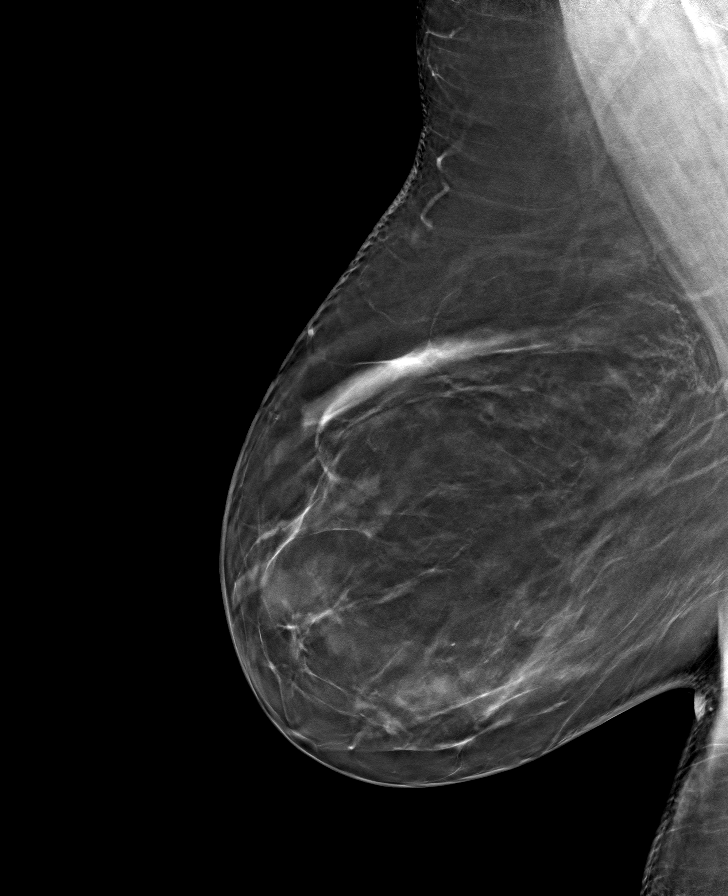

[L MLO tomo · tomo slice 45/88.0]
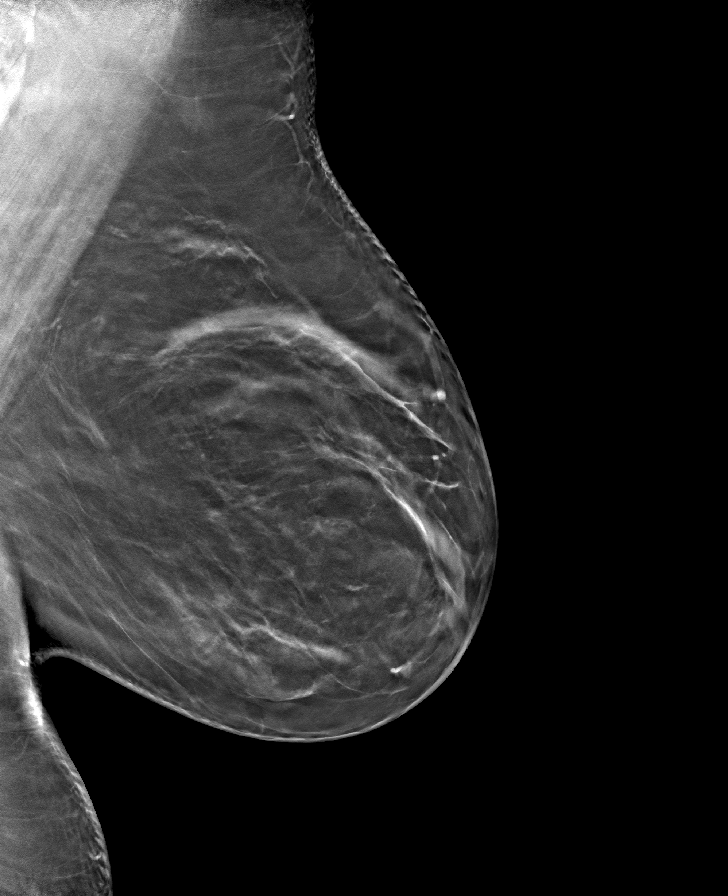

[R CC tomo · tomo slice 39/78.0]
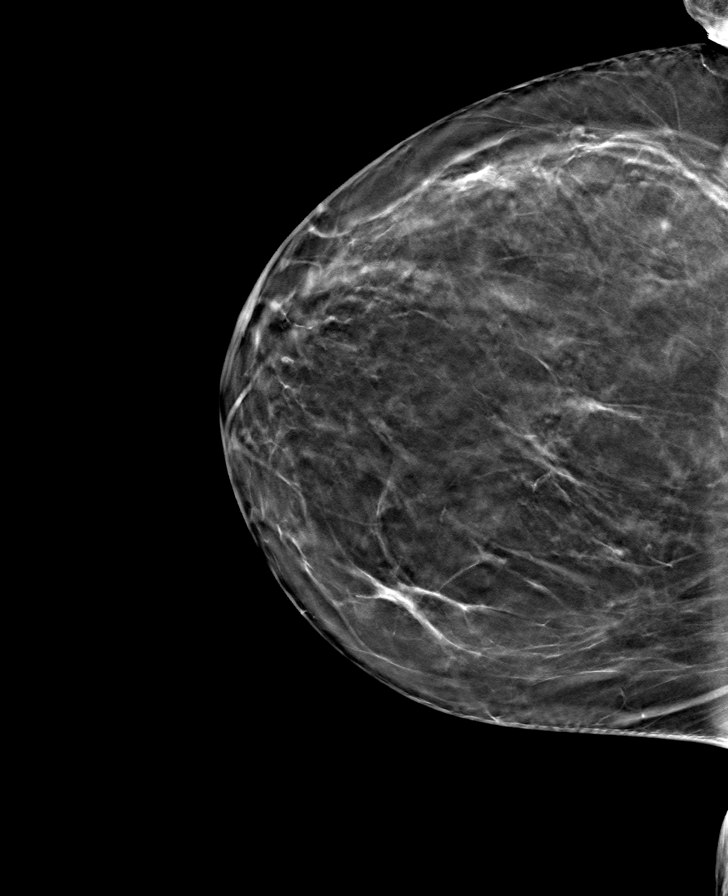

[L CC tomo · tomo slice 40/79.0]
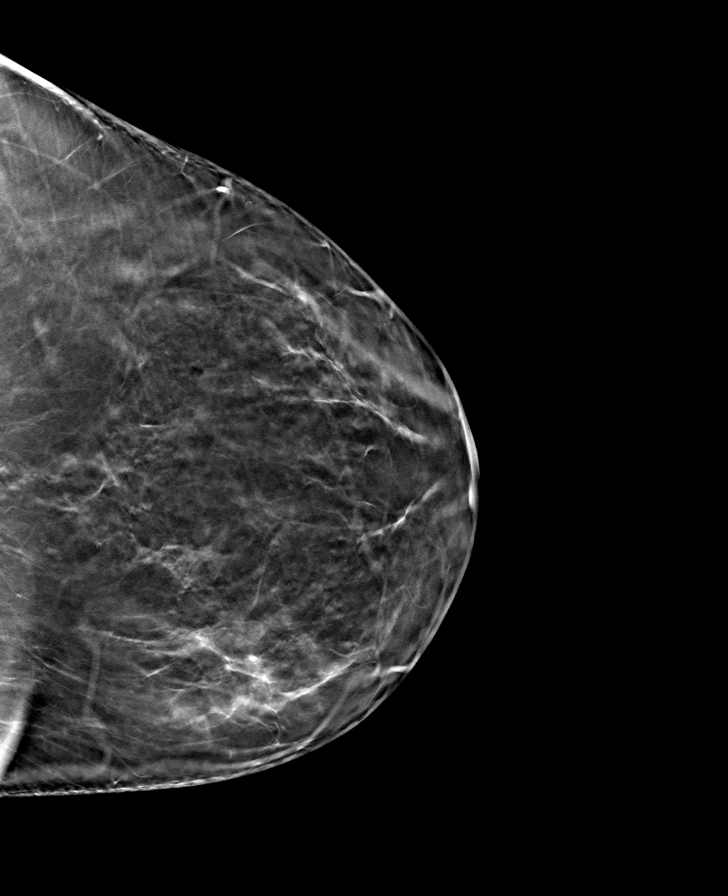

[8 of 24 positions shown; findings below may reference images not displayed]

ACR Breast Density Category b: There are scattered areas of
fibroglandular density.
FINDINGS: There are no findings suspicious for malignancy.
IMPRESSION: No mammographic evidence of malignancy. A result letter of this
screening mammogram will be mailed directly to the patient.

RECOMMENDATION:
Screening mammogram in one year. (Code:XG-X-X7B)

BI-RADS CATEGORY  1: Negative.

## 2023-10-09 ENCOUNTER — Encounter: Payer: Self-pay | Admitting: Neurology

## 2023-10-09 ENCOUNTER — Other Ambulatory Visit: Payer: Self-pay

## 2023-10-09 DIAGNOSIS — R202 Paresthesia of skin: Secondary | ICD-10-CM

## 2023-11-06 ENCOUNTER — Ambulatory Visit (INDEPENDENT_AMBULATORY_CARE_PROVIDER_SITE_OTHER): Payer: 59 | Admitting: Neurology

## 2023-11-06 DIAGNOSIS — G5603 Carpal tunnel syndrome, bilateral upper limbs: Secondary | ICD-10-CM

## 2023-11-06 DIAGNOSIS — R202 Paresthesia of skin: Secondary | ICD-10-CM | POA: Diagnosis not present

## 2023-11-06 NOTE — Procedures (Signed)
[facility] Neurology  [address] [place] [address], [address]  [place], [place] [address] Tel: [phone] Fax: [phone] Test Date:  11/06/2023  Patient: Deborah [name] DOB: June 06, 1981 Physician: Jacquelyne Balint, MD  Sex: Female Height: 5\' 3"  Ref Phys: Betsey Holiday, PA-C  ID#: [id number]   Technician:    History: This is a 43 year old female with numbness and tingling in her arms.  NCV & EMG Findings: Extensive electrodiagnostic evaluation of bilateral upper limbs shows: Bilateral median sensory responses show prolonged distal peak latency (L3.9, R3.6 ms). Bilateral ulnar and radial sensory responses are within normal limits. Bilateral median (APB) motor responses show prolonged distal onset latency (L4.1, R4.2 ms). Bilateral ulnar (ADM) motor responses are within normal limits. There is no evidence of active or chronic motor axon loss changes affecting any of the tested muscles on needle examination. Motor unit configuration and recruitment pattern is within normal limits.  Impression: This is an abnormal study. The findings are most consistent with the following: Evidence of bilateral median mononeuropathy at or distal to the wrist, consistent with carpal tunnel syndrome, mild in degree electrically. No electrodiagnostic evidence of a right or left cervical (C5-C8) motor radiculopathy. Screening studies for right or left ulnar or radial mononeuropathies are normal.    ___________________________ Jacquelyne Balint, MD    Nerve Conduction Studies Motor Nerve Results    Latency Amplitude F-Lat Segment Distance CV Comment  Site (ms) Norm (mV) Norm (ms)  (cm) (m/s) Norm   Left Median (APB) Motor  Wrist *4.1  < 3.9 10.8  > 6.0        Elbow 7.8 - 10.4 -  Elbow-Wrist 25 68  > 50   Right Median (APB) Motor  Wrist *4.2  < 3.9 10.4  > 6.0        Elbow 8.1 - 10.2 -  Elbow-Wrist 25 64  > 50   Left Ulnar (ADM) Motor  Wrist 1.63  < 3.1 12.7  > 7.0        Bel elbow 4.7 - 12.2 -  Bel elbow-Wrist 20  65  > 50   Ab elbow 6.2 - 11.3 -  Ab elbow-Bel elbow 10 67 -   Right Ulnar (ADM) Motor  Wrist 2.0  < 3.1 12.1  > 7.0        Bel elbow 5.2 - 12.1 -  Bel elbow-Wrist 22 69  > 50   Ab elbow 6.5 - 11.9 -  Ab elbow-Bel elbow 10 77 -    Sensory Sites    Neg Peak Lat Amplitude (O-P) Segment Distance Velocity Comment  Site (ms) Norm (V) Norm  (cm) (ms)   Left Median Sensory  Wrist-Dig II *3.9  < 3.4 33  > 20 Wrist-Dig II 13    Right Median Sensory  Wrist-Dig II *3.6  < 3.4 30  > 20 Wrist-Dig II 13    Left Radial Sensory  Forearm-Wrist 1.88  < 2.7 47  > 18 Forearm-Wrist 10    Right Radial Sensory  Forearm-Wrist 1.68  < 2.7 51  > 18 Forearm-Wrist 10    Left Ulnar Sensory  Wrist-Dig V 2.3  < 3.1 38  > 12 Wrist-Dig V 11    Right Ulnar Sensory  Wrist-Dig V 2.3  < 3.1 44  > 12 Wrist-Dig V 11     Electromyography   Side Muscle Ins.Act Fibs Fasc Recrt Amp Dur Poly Activation Comment  Right FDI Nml Nml Nml Nml Nml Nml Nml Nml N/A  Right  EIP Nml Nml Nml Nml Nml Nml Nml Nml N/A  Right Pronator teres Nml Nml Nml Nml Nml Nml Nml Nml N/A  Right Biceps Nml Nml Nml Nml Nml Nml Nml Nml N/A  Right Triceps Nml Nml Nml Nml Nml Nml Nml Nml N/A  Right Deltoid Nml Nml Nml Nml Nml Nml Nml Nml N/A  Left FDI Nml Nml Nml Nml Nml Nml Nml Nml N/A  Left EIP Nml Nml Nml Nml Nml Nml Nml Nml N/A  Left Pronator teres Nml Nml Nml Nml Nml Nml Nml Nml N/A  Left Biceps Nml Nml Nml Nml Nml Nml Nml Nml N/A  Left Triceps Nml Nml Nml Nml Nml Nml Nml Nml N/A  Left Deltoid Nml Nml Nml Nml Nml Nml Nml Nml N/A      Waveforms:  Motor           Sensory

## 2023-11-10 ENCOUNTER — Telehealth: Payer: Self-pay | Admitting: Neurology

## 2023-11-10 NOTE — Telephone Encounter (Signed)
Patient has been wearing these for 8 years, can we go ahead and place referral?

## 2023-11-10 NOTE — Telephone Encounter (Signed)
Pt called in stating she got an EMG done on 11/06/23 and those results were sent to her primary care doctor. Her PCP did not feel she knew enough to explain the results to her. The pt would like to see if our office could call her to explain the results?

## 2023-11-10 NOTE — Telephone Encounter (Signed)
I advised patient to contact PCP per Dr.Hill

## 2024-06-26 ENCOUNTER — Other Ambulatory Visit: Payer: Self-pay | Admitting: Physician Assistant

## 2024-06-26 DIAGNOSIS — Z1231 Encounter for screening mammogram for malignant neoplasm of breast: Secondary | ICD-10-CM
